# Patient Record
Sex: Female | Born: 1965
Health system: Southern US, Community
[De-identification: ages and names within clinical notes are randomized; demographics above are authoritative.]

## PROBLEM LIST (undated history)

## (undated) DIAGNOSIS — F419 Anxiety disorder, unspecified: Secondary | ICD-10-CM

## (undated) DIAGNOSIS — I10 Essential (primary) hypertension: Secondary | ICD-10-CM

## (undated) DIAGNOSIS — E785 Hyperlipidemia, unspecified: Secondary | ICD-10-CM

## (undated) DIAGNOSIS — G459 Transient cerebral ischemic attack, unspecified: Secondary | ICD-10-CM

## (undated) DIAGNOSIS — I639 Cerebral infarction, unspecified: Secondary | ICD-10-CM

## (undated) DIAGNOSIS — G4730 Sleep apnea, unspecified: Secondary | ICD-10-CM

## (undated) DIAGNOSIS — F329 Major depressive disorder, single episode, unspecified: Secondary | ICD-10-CM

## (undated) DIAGNOSIS — F32A Depression, unspecified: Secondary | ICD-10-CM

## (undated) DIAGNOSIS — N62 Hypertrophy of breast: Secondary | ICD-10-CM

## (undated) HISTORY — DX: Transient cerebral ischemic attack, unspecified: G45.9

## (undated) HISTORY — DX: Essential (primary) hypertension: I10

## (undated) HISTORY — DX: Hyperlipidemia, unspecified: E78.5

## (undated) HISTORY — DX: Depression, unspecified: F32.A

## (undated) HISTORY — DX: Anxiety disorder, unspecified: F41.9

## (undated) HISTORY — DX: Major depressive disorder, single episode, unspecified: F32.9

## (undated) SURGERY — BREAST REDUCTION WITH LIPOSUCTION
Anesthesia: General | Laterality: Bilateral

---
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
## 2008-10-07 DIAGNOSIS — I639 Cerebral infarction, unspecified: Secondary | ICD-10-CM | POA: Insufficient documentation

## 2008-11-11 DIAGNOSIS — R002 Palpitations: Secondary | ICD-10-CM | POA: Insufficient documentation

## 2008-11-14 ENCOUNTER — Ambulatory Visit: Payer: Self-pay | Admitting: Family Medicine

## 2008-11-15 ENCOUNTER — Ambulatory Visit: Payer: Self-pay | Admitting: Family Medicine

## 2009-02-07 HISTORY — PX: CAROTID ENDARTERECTOMY: SUR193

## 2009-11-17 DIAGNOSIS — L680 Hirsutism: Secondary | ICD-10-CM | POA: Insufficient documentation

## 2009-12-25 ENCOUNTER — Ambulatory Visit: Payer: Self-pay | Admitting: Family Medicine

## 2010-01-24 DIAGNOSIS — G4733 Obstructive sleep apnea (adult) (pediatric): Secondary | ICD-10-CM | POA: Insufficient documentation

## 2010-05-05 DIAGNOSIS — E785 Hyperlipidemia, unspecified: Secondary | ICD-10-CM | POA: Insufficient documentation

## 2010-06-11 DIAGNOSIS — E213 Hyperparathyroidism, unspecified: Secondary | ICD-10-CM

## 2010-06-11 HISTORY — DX: Hyperparathyroidism, unspecified: E21.3

## 2010-08-09 DIAGNOSIS — E079 Disorder of thyroid, unspecified: Secondary | ICD-10-CM | POA: Insufficient documentation

## 2010-08-11 DIAGNOSIS — I6529 Occlusion and stenosis of unspecified carotid artery: Secondary | ICD-10-CM | POA: Insufficient documentation

## 2010-11-30 ENCOUNTER — Ambulatory Visit: Payer: Self-pay | Admitting: Family Medicine

## 2011-04-22 ENCOUNTER — Ambulatory Visit: Payer: Self-pay | Admitting: Family Medicine

## 2012-08-23 ENCOUNTER — Ambulatory Visit: Payer: Self-pay | Admitting: Family Medicine

## 2012-08-23 LAB — HM MAMMOGRAPHY

## 2012-09-23 IMAGING — CR DG CHEST 2V
1 series · 2 of 2 positions shown · non-contrast
Comparison: none

REASON FOR EXAM: fever cough
COMMENTS:

PROCEDURE:     KDR - KDXR CHEST PA (OR AP) AND LAT  - November 30, 2010  [DATE]
RESULT:     The lungs are clear. The heart and pulmonary vessels are normal.
The bony and mediastinal structures are unremarkable. There is no effusion.
There is no pneumothorax or evidence of congestive failure.

[Series 1: view not recorded · 0.17mm/px · 2 of 2 slices shown]
[im 1/2]
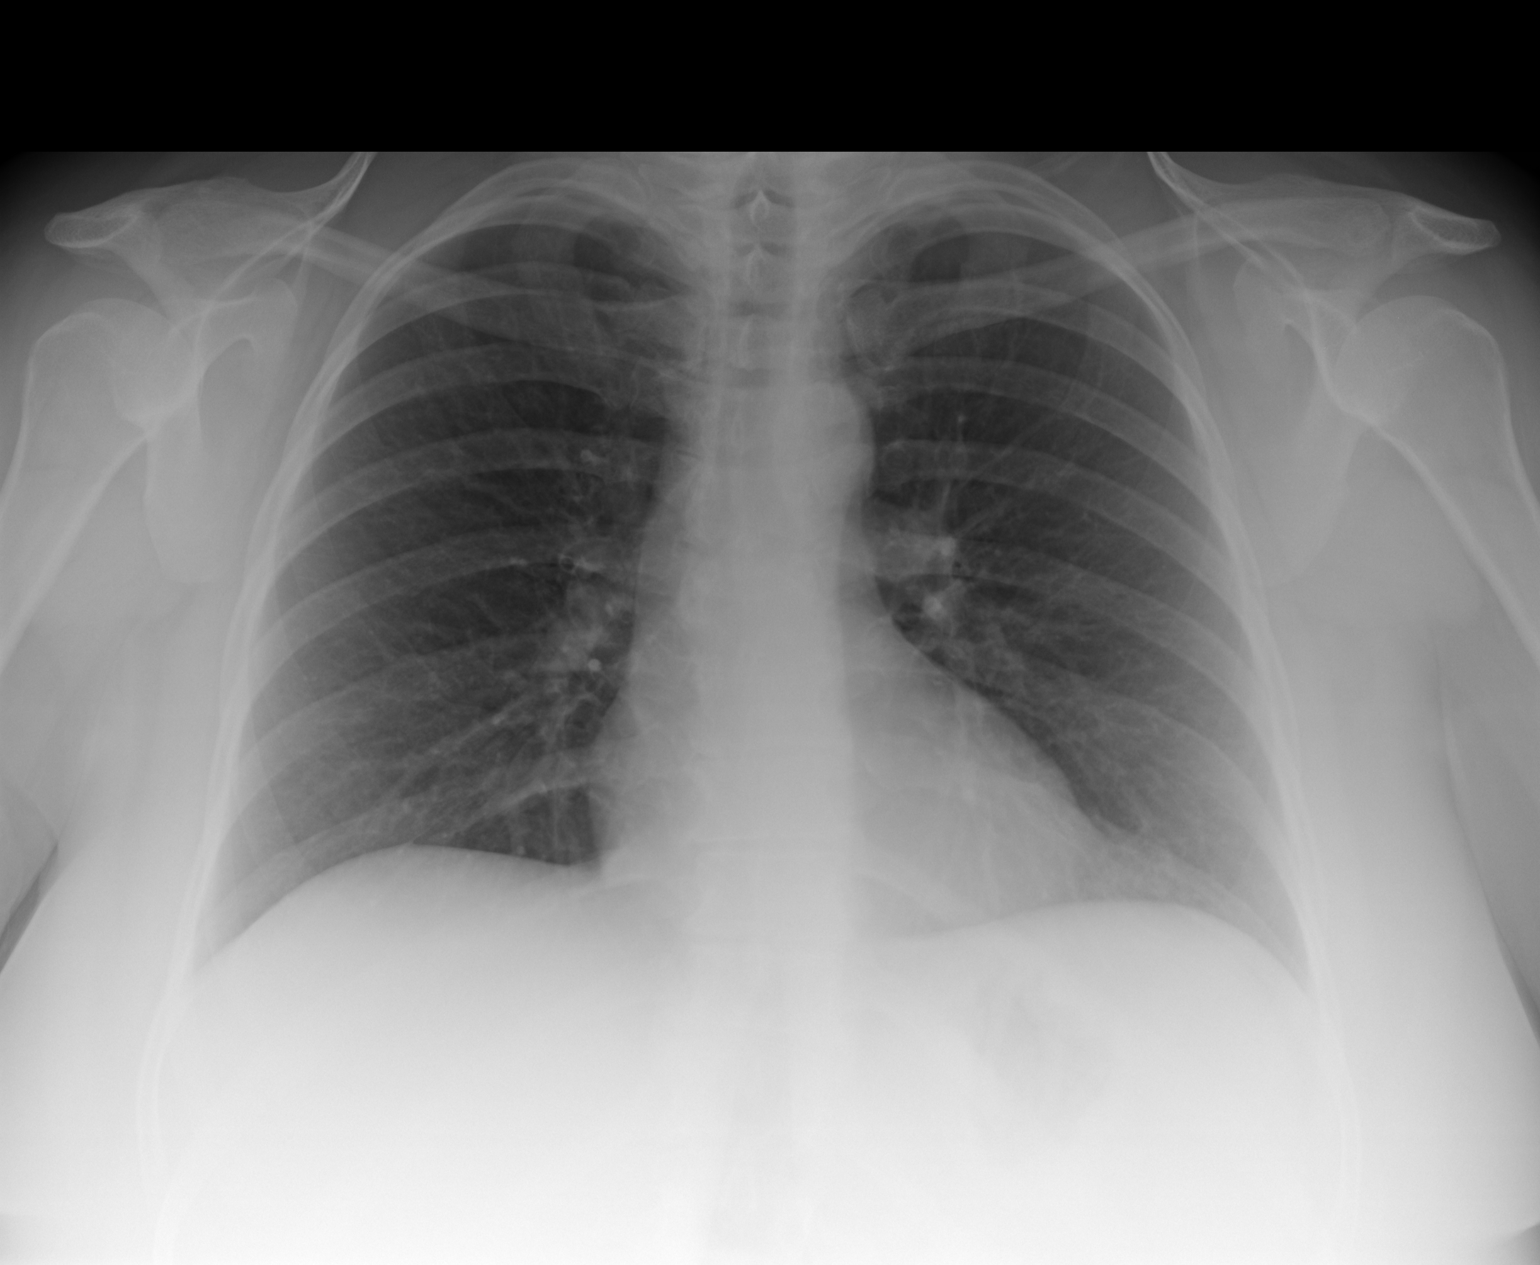
[im 2/2]
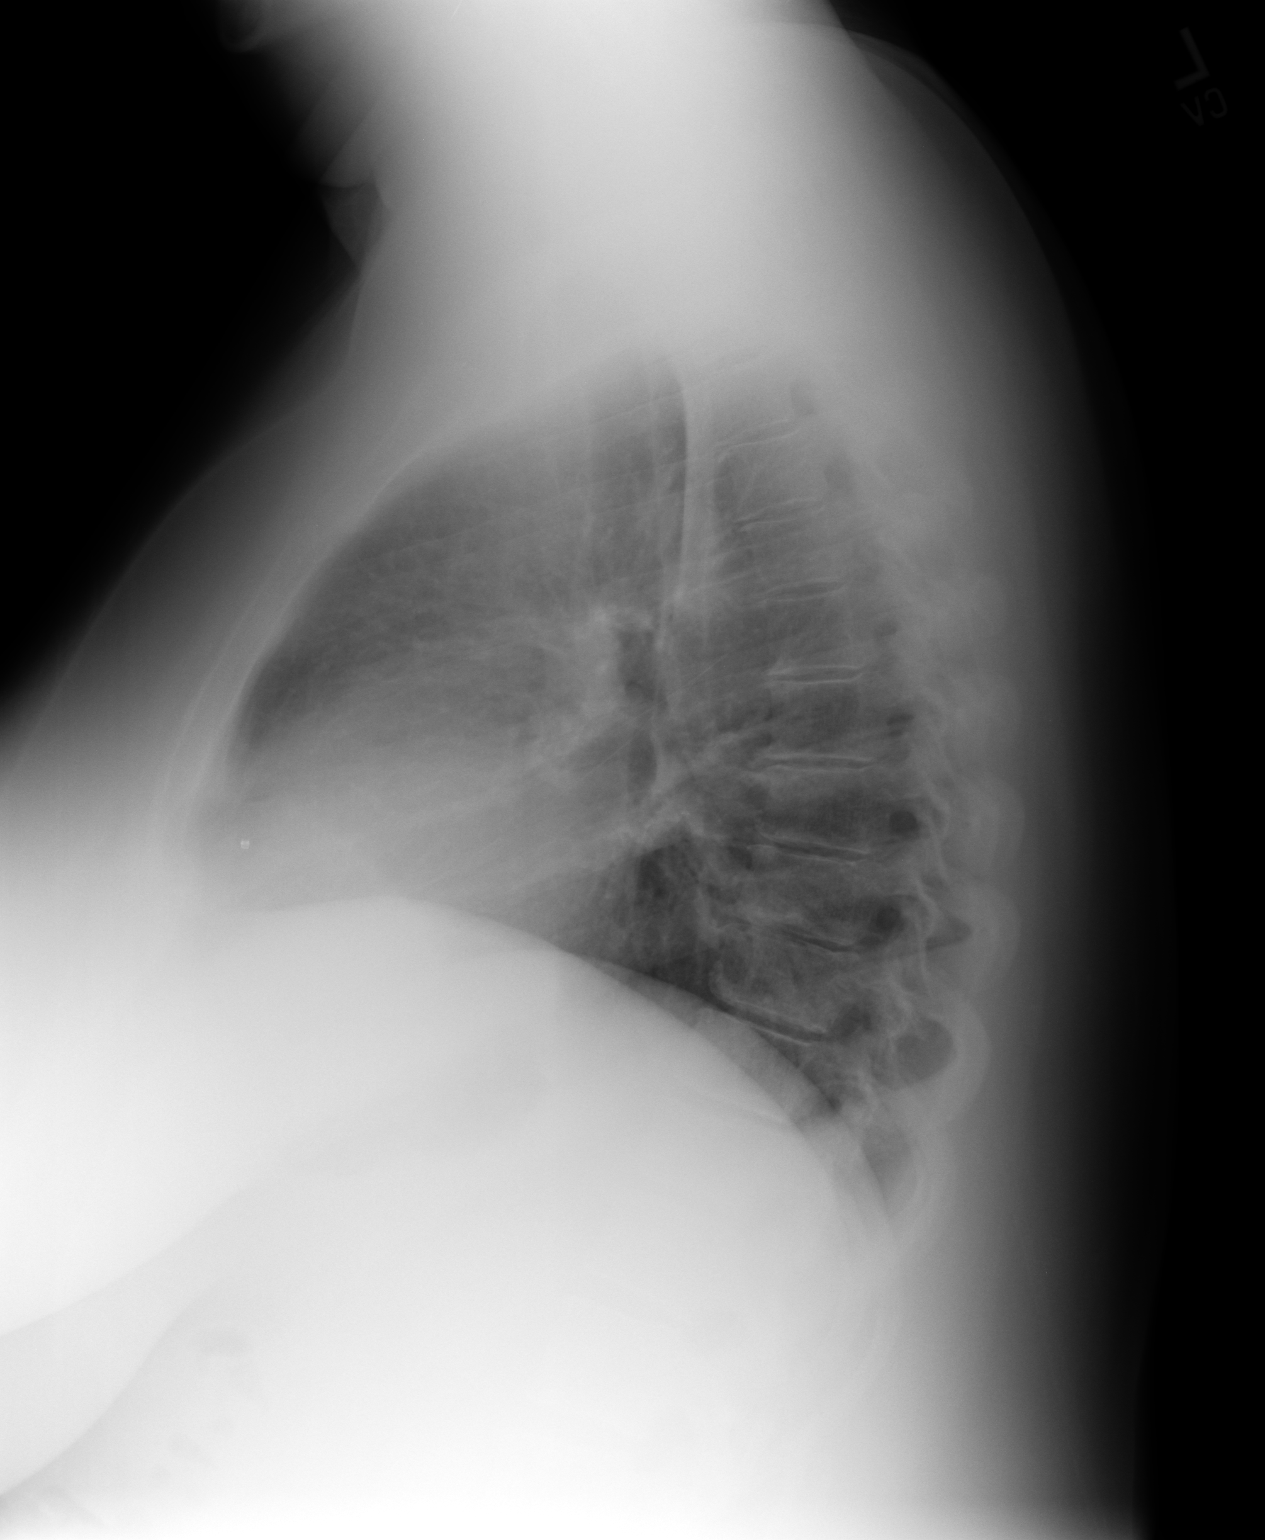

[2 of 2 positions shown; findings below may reference images not displayed]

IMPRESSION: No acute cardiopulmonary disease.

## 2013-02-13 IMAGING — US US EXTREM LOW VENOUS*L*
1 series · 17 of 24 positions shown · non-contrast
Comparison: none

REASON FOR EXAM: STAT CR to Dr Kellar [phone] or [phone] left
leg pain  eval DVT
COMMENTS:

PROCEDURE:     EDUARD - EDUARD DOPPLER VEINS LT LEG EXTR  - April 22, 2011  [DATE]
RESULT:     Comparison: None

[Series 1: us extrem low venous*left* · 17 of 28 slices shown]
[im 1/28]
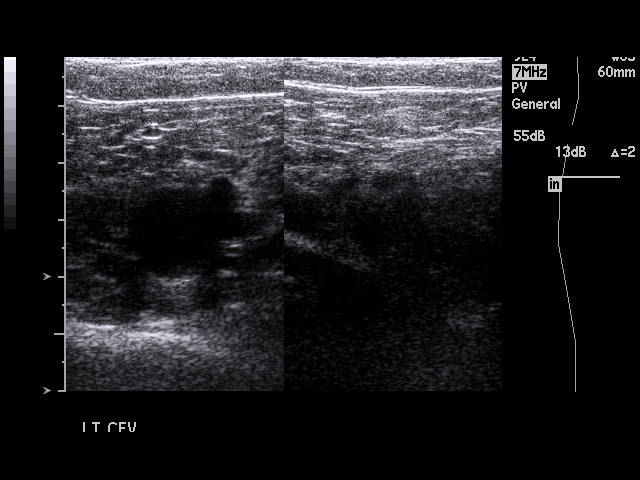
[im 3/28]
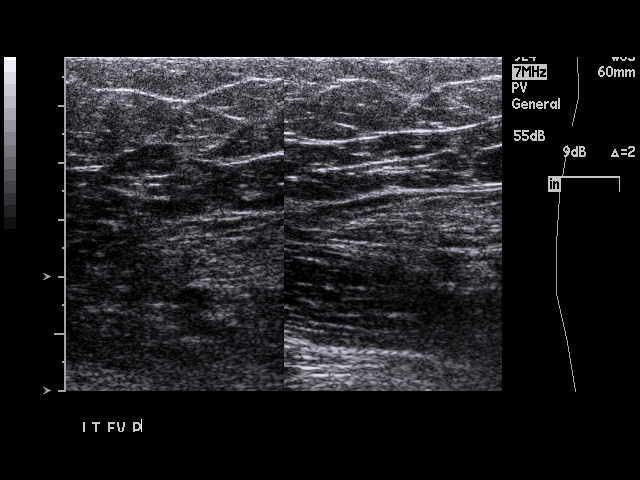
[im 4/28]
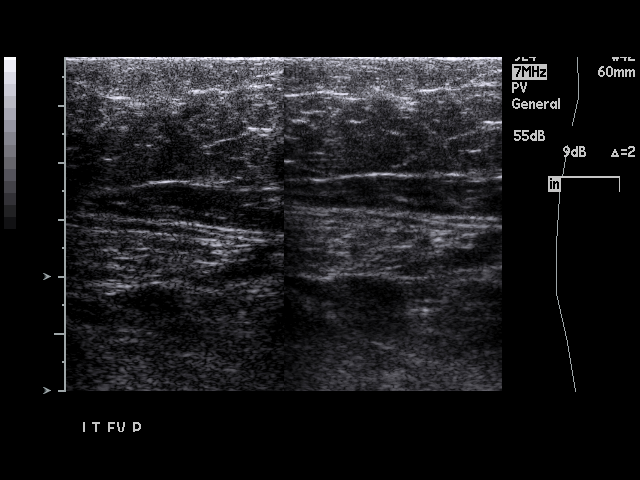
[im 5/28]
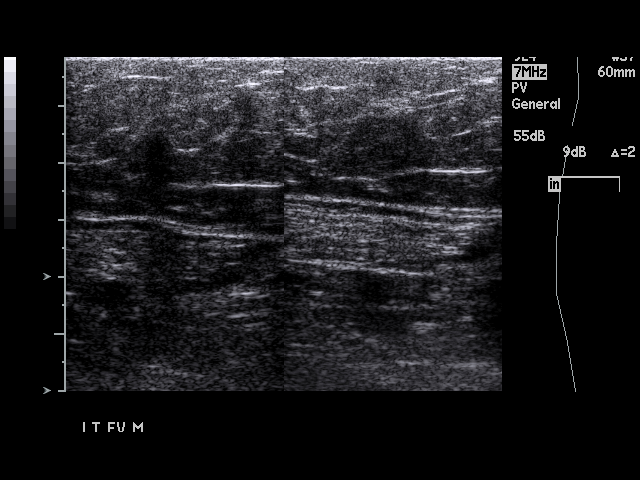
[im 8/28]
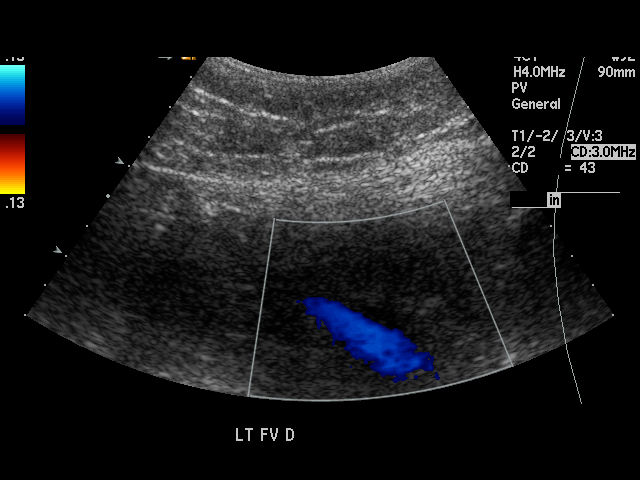
[im 9/28]
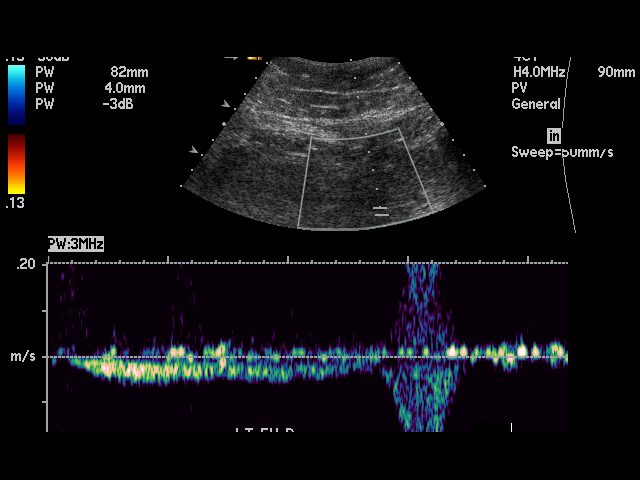
[im 11/28]
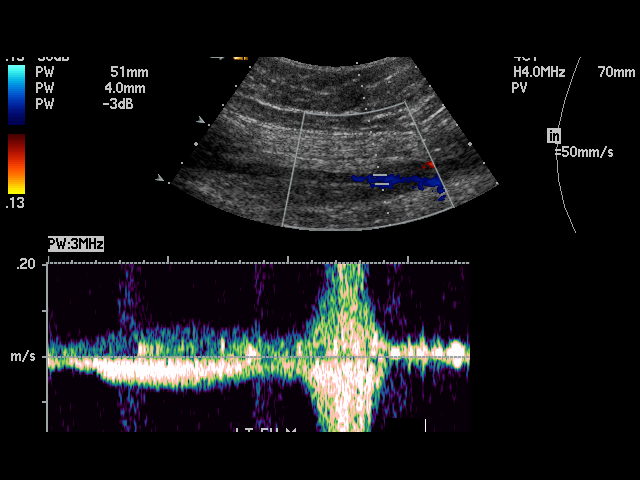
[im 12/28]
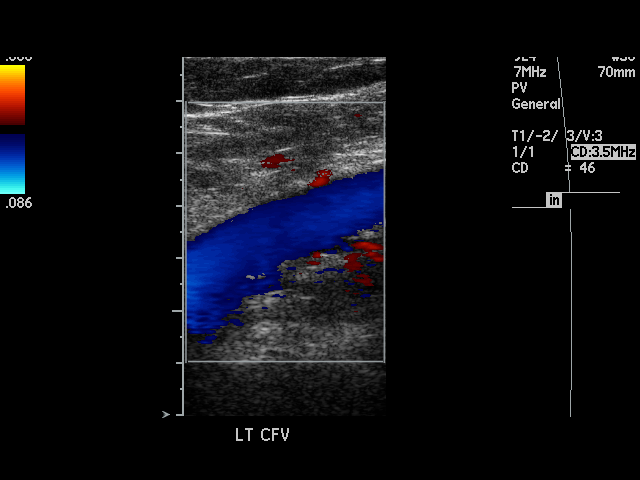
[im 15/28]
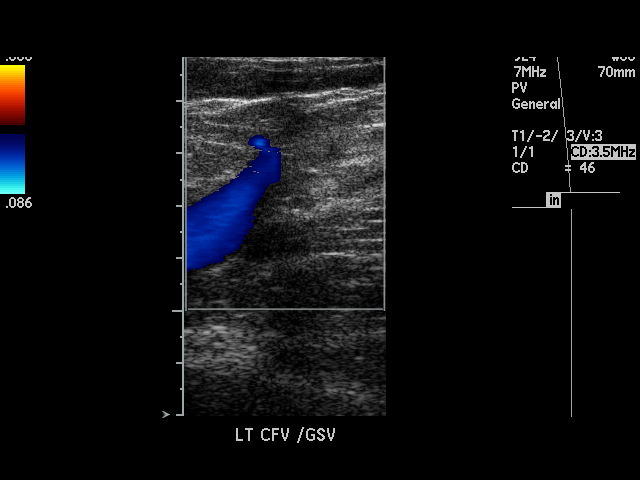
[im 16/28]
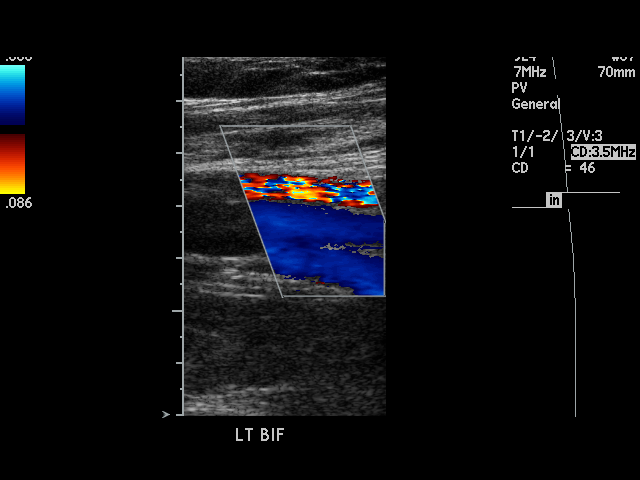
[im 17/28]
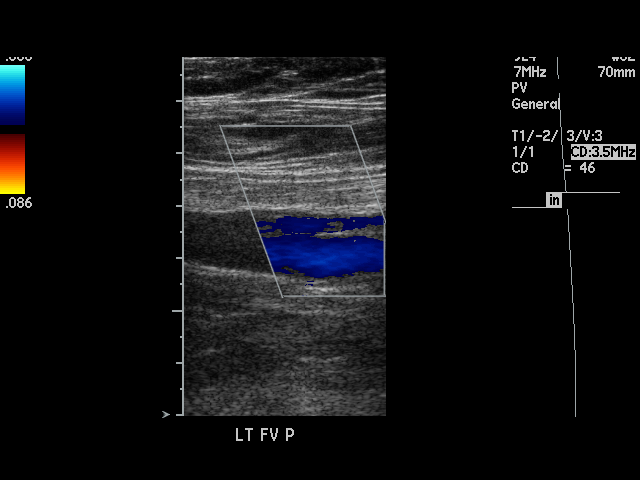
[im 19/28]
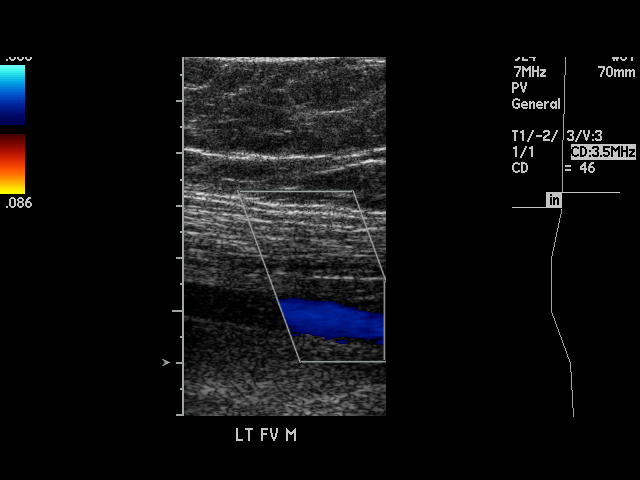
[im 20/28]
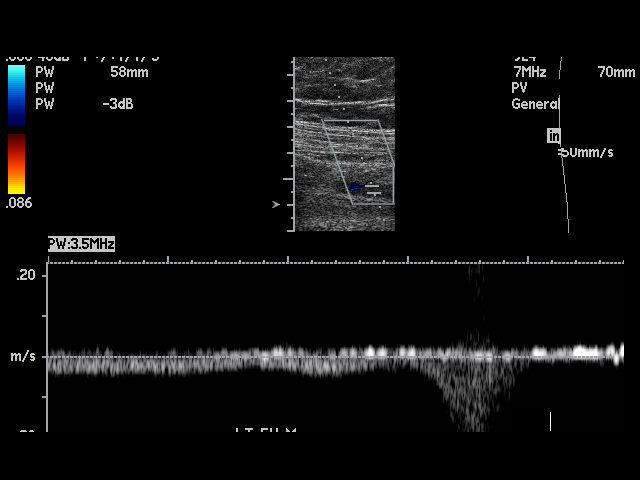
[im 23/28]
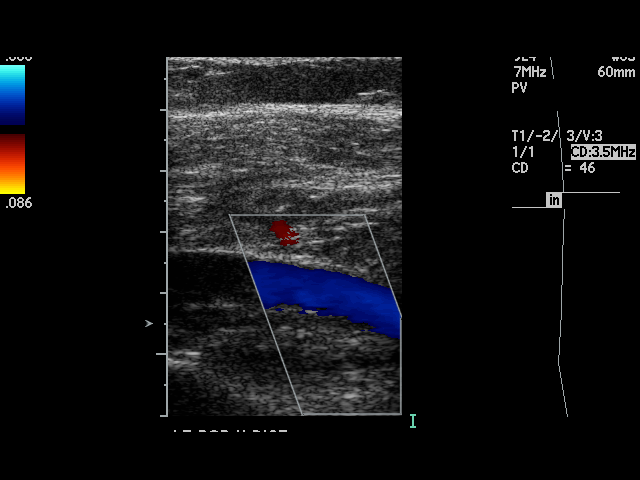
[im 24/28]
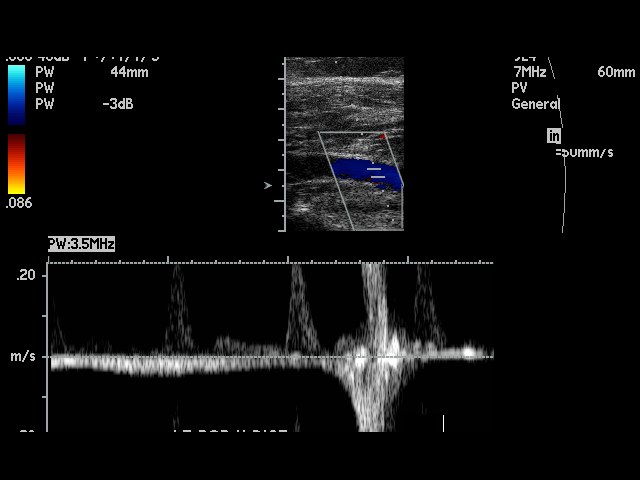
[im 25/28]
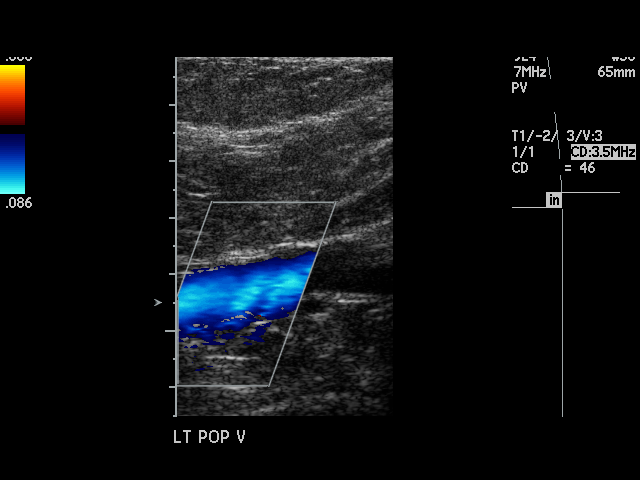
[im 28/28]
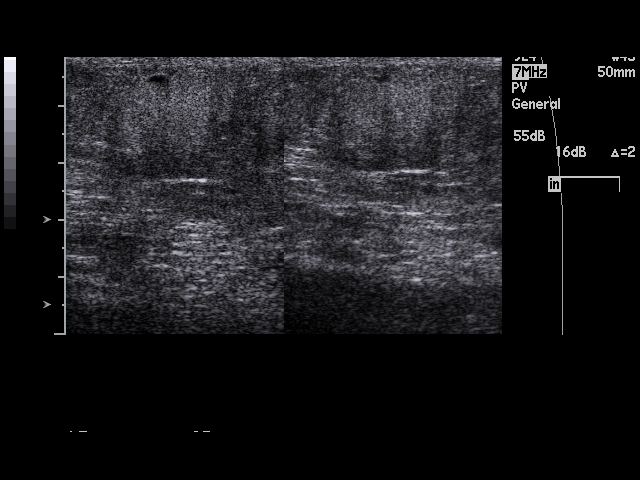

[17 of 24 positions shown; findings below may reference images not displayed]

FINDINGS: Multiple longitudinal and transverse gray-scale as well as color
and spectral Doppler images of the left lower extremity veins were obtained
from the common femoral veins through the popliteal veins.

The left common femoral, greater saphenous, femoral, popliteal veins, and
venous trifurcation are patent, demonstrating normal color-flow and
compressibility. No intraluminal thrombus is identified.There is normal
respiratory variation and augmentation demonstrated at all vein levels.
IMPRESSION: No evidence of DVT in the left lower extremity.

## 2013-06-27 LAB — HM PAP SMEAR: HM Pap smear: NEGATIVE

## 2014-08-08 LAB — LIPID PANEL
Cholesterol: 219 mg/dL — AB (ref 0–200)
HDL: 66 mg/dL (ref 35–70)
LDL Cholesterol: 129 mg/dL
Triglycerides: 118 mg/dL (ref 40–160)

## 2014-08-08 LAB — BASIC METABOLIC PANEL
BUN: 12 mg/dL (ref 4–21)
Creatinine: 0.7 mg/dL (ref 0.5–1.1)
Glucose: 91 mg/dL
Potassium: 4.5 mmol/L (ref 3.4–5.3)
Sodium: 141 mmol/L (ref 137–147)

## 2014-08-08 LAB — CBC AND DIFFERENTIAL
HCT: 42 % (ref 36–46)
Hemoglobin: 15.2 g/dL (ref 12.0–16.0)
WBC: 5.2 10*3/mL

## 2014-08-08 LAB — HEPATIC FUNCTION PANEL
ALT: 15 U/L (ref 7–35)
AST: 18 U/L (ref 13–35)

## 2015-01-27 ENCOUNTER — Telehealth: Payer: Self-pay | Admitting: Family Medicine

## 2015-02-06 ENCOUNTER — Other Ambulatory Visit: Payer: Self-pay | Admitting: Family Medicine

## 2015-02-06 DIAGNOSIS — F419 Anxiety disorder, unspecified: Secondary | ICD-10-CM

## 2015-02-06 NOTE — Telephone Encounter (Signed)
Printed, please fax or call in to pharmacy. Thank you.   

## 2015-04-16 DIAGNOSIS — M79669 Pain in unspecified lower leg: Secondary | ICD-10-CM | POA: Insufficient documentation

## 2015-04-16 DIAGNOSIS — G453 Amaurosis fugax: Secondary | ICD-10-CM

## 2015-04-16 DIAGNOSIS — B351 Tinea unguium: Secondary | ICD-10-CM | POA: Insufficient documentation

## 2015-04-16 DIAGNOSIS — F329 Major depressive disorder, single episode, unspecified: Secondary | ICD-10-CM | POA: Insufficient documentation

## 2015-04-16 DIAGNOSIS — E669 Obesity, unspecified: Secondary | ICD-10-CM | POA: Insufficient documentation

## 2015-04-16 DIAGNOSIS — F32A Depression, unspecified: Secondary | ICD-10-CM | POA: Insufficient documentation

## 2015-04-16 DIAGNOSIS — B354 Tinea corporis: Secondary | ICD-10-CM | POA: Insufficient documentation

## 2015-04-16 DIAGNOSIS — N926 Irregular menstruation, unspecified: Secondary | ICD-10-CM | POA: Insufficient documentation

## 2015-04-16 DIAGNOSIS — N72 Inflammatory disease of cervix uteri: Secondary | ICD-10-CM | POA: Insufficient documentation

## 2015-04-16 DIAGNOSIS — E559 Vitamin D deficiency, unspecified: Secondary | ICD-10-CM | POA: Insufficient documentation

## 2015-04-16 DIAGNOSIS — Z7251 High risk heterosexual behavior: Secondary | ICD-10-CM | POA: Insufficient documentation

## 2015-04-16 DIAGNOSIS — G43909 Migraine, unspecified, not intractable, without status migrainosus: Secondary | ICD-10-CM | POA: Insufficient documentation

## 2015-04-16 HISTORY — DX: Amaurosis fugax: G45.3

## 2015-04-17 ENCOUNTER — Ambulatory Visit (INDEPENDENT_AMBULATORY_CARE_PROVIDER_SITE_OTHER): Payer: BLUE CROSS/BLUE SHIELD | Admitting: Family Medicine

## 2015-04-17 ENCOUNTER — Encounter: Payer: Self-pay | Admitting: Family Medicine

## 2015-04-17 VITALS — BP 110/68 | HR 68 | Temp 98.3°F | Resp 16 | Ht 62.5 in | Wt 242.0 lb

## 2015-04-17 DIAGNOSIS — F325 Major depressive disorder, single episode, in full remission: Secondary | ICD-10-CM | POA: Insufficient documentation

## 2015-04-17 DIAGNOSIS — F324 Major depressive disorder, single episode, in partial remission: Secondary | ICD-10-CM

## 2015-04-17 MED ORDER — ESCITALOPRAM OXALATE 10 MG PO TABS: 10.0000 mg | ORAL_TABLET | Freq: Every day | ORAL | Status: DC

## 2015-04-17 NOTE — Progress Notes (Signed)
Patient: Rachael Gilmore Female    DOB: [birth year]   49 y.o.   MRN: [id number] Visit Date: 04/17/2015  Today's Provider: Lorie Phenix, MD   Chief Complaint  Patient presents with  . Depression   Subjective:    HPI   Follow up for Depression  The patient was last seen for this 9 months ago. Changes made at last visit include starting Escitalopram 10 mg. Has really helped.   She reports excellent compliance with treatment. She feels that condition is Improved. She is not having side effects. Patient reports that she would like to discontinue medication. Patient reports she is feeling 100% better.   Living in apartment by herself. Working 2 jobs and going to school. Has not need Xanax very much. Only needed it 2 times since June. Started working out again also.   Now wants to taper off Escitalopram.   Depression screen [facility] 2/9 04/17/2015  Decreased Interest 0  Down, Depressed, Hopeless 0  PHQ - 2 Score 0  Altered sleeping 1  Tired, decreased energy 0  Change in appetite 0  Feeling bad or failure about yourself  0  Trouble concentrating 0  Moving slowly or fidgety/restless 0  Suicidal thoughts 0  PHQ-9 Score 1  Difficult doing work/chores Not difficult at all      Allergies not on file Previous Medications   ALPRAZOLAM (XANAX) 0.5 MG TABLET    TAKE ONE-HALF TO ONE TABLET BY MOUTH TWICE DAILY AS NEEDED   ASPIRIN BUF,CACARB-MGCARB-MGO, (BUFFERED ASPIRIN) 325 MG TABS    Take 1 tablet by mouth daily.   COENZYME Q10 (CO Q 10) 100 MG CAPS    Take 1 capsule by mouth daily.   ESCITALOPRAM (LEXAPRO) 10 MG TABLET    Take 1 tablet by mouth daily.   MULTIPLE VITAMIN PO    Take 1 tablet by mouth daily.   NYSTATIN OINTMENT (MYCOSTATIN)    NYSTATIN, 100000 UNIT/GM (External Ointment)  1 Ointment Ointment twice daily as needed for 0 days  Quantity: 60;  Refills: 1   Ordered :28-Aug-2012  Allene Dillon ;  Started 28-Aug-2012 Active Comments: Medication taken as needed.      OMEGA-3 FATTY ACIDS (FISH OIL DOUBLE STRENGTH) 1200 MG CAPS    Take 1 capsule by mouth daily.   PRAVASTATIN (PRAVACHOL) 40 MG TABLET    Take 1 tablet by mouth at bedtime.   TRIAMTERENE-HYDROCHLOROTHIAZIDE (MAXZIDE-25) 37.5-25 MG PER TABLET    Take 1 tablet by mouth daily.    Review of Systems  Constitutional: Negative.   Cardiovascular: Negative.   Psychiatric/Behavioral: Negative.     Social History  Substance Use Topics  . Smoking status: Former Smoker    Quit date: 03/08/2009  . Smokeless tobacco: Never Used  . Alcohol Use: No   Objective:   BP 110/68 mmHg  Pulse 68  Temp(Src) 98.3 F (36.8 C) (Oral)  Resp 16  Ht 5' 2.5" (1.588 m)  Wt 242 lb (109.77 kg)  BMI 43.53 kg/m2  SpO2 98%  Physical Exam  Constitutional: She is oriented to person, place, and time. She appears well-developed and well-nourished.  Neurological: She is alert and oriented to person, place, and time.  Psychiatric: She has a normal mood and affect.      Assessment & Plan:     1. Major depression in remission Greatly improved. Will taper medication.  Call if has any problem with taper or is unsuccessful and remains on the medication.  -  escitalopram (LEXAPRO) 10 MG tablet; Take 1 tablet (10 mg total) by mouth daily. 1 alternating with 1/2 for about  2 weeks and then 1/2 every day for about 2 to 3 weeks and then one every other day for week or so and then off.  Dispense: 30 tablet; Refill: 1       Lorie Phenix, MD  [facility]

## 2015-07-21 ENCOUNTER — Ambulatory Visit (INDEPENDENT_AMBULATORY_CARE_PROVIDER_SITE_OTHER): Payer: BLUE CROSS/BLUE SHIELD | Admitting: Family Medicine

## 2015-07-21 ENCOUNTER — Encounter: Payer: Self-pay | Admitting: Family Medicine

## 2015-07-21 VITALS — BP 110/82 | HR 72 | Temp 98.7°F | Resp 16 | Wt 228.0 lb

## 2015-07-21 DIAGNOSIS — F39 Unspecified mood [affective] disorder: Secondary | ICD-10-CM | POA: Diagnosis not present

## 2015-07-21 DIAGNOSIS — E785 Hyperlipidemia, unspecified: Secondary | ICD-10-CM

## 2015-07-21 DIAGNOSIS — I10 Essential (primary) hypertension: Secondary | ICD-10-CM

## 2015-07-21 DIAGNOSIS — B354 Tinea corporis: Secondary | ICD-10-CM | POA: Diagnosis not present

## 2015-07-21 DIAGNOSIS — N62 Hypertrophy of breast: Secondary | ICD-10-CM | POA: Diagnosis not present

## 2015-07-21 DIAGNOSIS — F338 Other recurrent depressive disorders: Secondary | ICD-10-CM | POA: Insufficient documentation

## 2015-07-21 HISTORY — DX: Tinea corporis: B35.4

## 2015-07-21 MED ORDER — PRAVASTATIN SODIUM 20 MG PO TABS: 20.0000 mg | ORAL_TABLET | Freq: Every day | ORAL | Status: DC

## 2015-07-21 MED ORDER — NYSTATIN 100000 UNIT/GM EX CREA: 1.0000 "application " | TOPICAL_CREAM | Freq: Two times a day (BID) | CUTANEOUS | Status: DC

## 2015-07-21 NOTE — Progress Notes (Signed)
Patient ID: Neville Routengela M Butson, female   DOB: 12/29/1965, 49 y.o.   MRN: [id number]        Patient: Neville Routengela M Mathisen Female    DOB: 12/29/1965   49 y.o.   MRN: [id number] Visit Date: 07/21/2015  Today's Provider: Lorie PhenixNancy Maloney, MD   Chief Complaint  Patient presents with  . Depression  . Shoulder Pain   Subjective:    HPI     No Known Allergies Previous Medications   ALPRAZOLAM (XANAX) 0.5 MG TABLET    TAKE ONE-HALF TO ONE TABLET BY MOUTH TWICE DAILY AS NEEDED   ASPIRIN BUF,CACARB-MGCARB-MGO, (BUFFERED ASPIRIN) 325 MG TABS    Take 1 tablet by mouth daily.   COENZYME Q10 (CO Q 10) 100 MG CAPS    Take 1 capsule by mouth daily.   ESCITALOPRAM (LEXAPRO) 10 MG TABLET    Take 1 tablet (10 mg total) by mouth daily. 1 alternating with 1/2 for about  2 weeks and then 1/2 every day for about 2 to 3 weeks and then one every other day for week or so and then off.   MELATONIN-PYRIDOXINE (MELATONEX PO)    Take 1 tablet by mouth at bedtime.   MULTIPLE VITAMIN PO    Take 1 tablet by mouth daily.   NYSTATIN OINTMENT (MYCOSTATIN)    NYSTATIN, 100000 UNIT/GM (External Ointment)  1 Ointment Ointment twice daily as needed for 0 days  Quantity: 60;  Refills: 1   Ordered :28-Aug-2012  Allene Dillonrozdowski, Emily ;  Started 28-Aug-2012 Active Comments: Medication taken as needed.    OMEGA-3 FATTY ACIDS (FISH OIL DOUBLE STRENGTH) 1200 MG CAPS    Take 1 capsule by mouth daily.   PRAVASTATIN (PRAVACHOL) 40 MG TABLET    Take 1 tablet by mouth at bedtime.   TRIAMTERENE-HYDROCHLOROTHIAZIDE (MAXZIDE-25) 37.5-25 MG PER TABLET    Take 1 tablet by mouth daily.    Review of Systems  Social History  Substance Use Topics  . Smoking status: Former Smoker    Quit date: 03/08/2009  . Smokeless tobacco: Never Used  . Alcohol Use: No   Objective:   There were no vitals taken for this visit.  Physical Exam      Assessment & Plan:           Lorie PhenixNancy Maloney, MD  [facility]

## 2015-07-21 NOTE — Progress Notes (Signed)
Subjective:    Patient ID: Rachael Gilmore, female    DOB: 1966/05/02, 49 y.o.   MRN: [id number]  HPI Comments: Pt has recently bought a dog, which is improving her anxiety/depression. Pt is requesting a note from PCP stating that dog is beneficial to pt's health so he can be registered as a therapy dog.   Depression        This is a recurrent problem.  The current episode started more than 1 year ago.   The problem occurs daily.  The problem has been gradually worsening since onset.  Associated symptoms include decreased concentration, fatigue, body aches, myalgias and headaches (secondary to back pain).  Associated symptoms include no helplessness, no hopelessness, does not have insomnia, not irritable, no restlessness, no decreased interest, no appetite change, no indigestion, not sad and no suicidal ideas.     Exacerbated by: seasonal changes, 2 jobs without benefits, full time student for business administration.    Past treatments include SSRIs - Selective serotonin reuptake inhibitors and other medications (Lexapro 10 mg (which pt has D/C by choice), has done well with this.  Does use  Xanax as needed.  Dog has been great. Has really helped  with mood, sleep, etc. ).  Compliance with treatment is good. Rash This is a recurrent problem. Location: under breasts. The rash is characterized by redness and itchiness (Same as previous. ). She was exposed to nothing (this rash appears with working out). Associated symptoms include fatigue. Treatments tried: Nystatin. Helped in the past.  The treatment provided moderate relief.  Back Pain This is a new problem. Pain location: C spine and bilateral shoulders. The quality of the pain is described as aching. The pain does not radiate. The pain is at a severity of 7/10. The pain is severe. The pain is worse during the day. Exacerbated by: pt believes this is secondary to large breasts. Associated symptoms include headaches (secondary to back pain). Risk  factors: Very large breast, in a 42 G, but could be even higher. Does have indentatiions in shoullders from bras.  She has tried heat and NSAIDs (massage therapy helps some. ) for the symptoms. The treatment provided mild relief.  Pt is requesting referral to surgery, Dr. Louisa Second with [facility] for breast reduction.    Review of Systems  Constitutional: Positive for fatigue. Negative for appetite change and unexpected weight change.  Musculoskeletal: Positive for myalgias and back pain.  Skin: Positive for rash.  Neurological: Positive for headaches (secondary to back pain).  Psychiatric/Behavioral: Positive for depression and decreased concentration. Negative for suicidal ideas. The patient is not nervous/anxious and does not have insomnia.    BP 110/82 mmHg  Pulse 72  Temp(Src) 98.7 F (37.1 C) (Oral)  Resp 16  Wt 228 lb (103.42 kg)   Patient Active Problem List   Diagnosis Date Noted  . Major depression in remission (HCC) 04/17/2015  . AF (amaurosis fugax) 04/16/2015  . Cervical inflammation 04/16/2015  . Irregular bleeding 04/16/2015  . Headache, migraine 04/16/2015  . Adiposity 04/16/2015  . Fungal infection of nail 04/16/2015  . Body tinea 04/16/2015  . Avitaminosis D 04/16/2015  . Anxiety 02/06/2015  . Carotid artery obstruction 08/11/2010  . HPTH (hyperparathyroidism) (HCC) 06/11/2010  . Hyperparathyroidism (HCC) 06/11/2010  . Apnea, sleep 01/24/2010  . Hairiness 11/17/2009  . Benign hypertension 03/07/2009  . Hypercholesteremia 02/27/2009  . Peripheral blood vessel disorder (HCC) 02/27/2009  . Awareness of heartbeats 11/11/2008  . Vision  disturbance 11/11/2008   Past Medical History  Diagnosis Date  . Anxiety   . Depression   . Hypertension   . Hyperlipidemia    Current Outpatient Prescriptions on File Prior to Visit  Medication Sig  . ALPRAZolam (XANAX) 0.5 MG tablet TAKE ONE-HALF TO ONE TABLET BY MOUTH TWICE DAILY AS  NEEDED  . Aspirin Buf,CaCarb-MgCarb-MgO, (BUFFERED ASPIRIN) 325 MG TABS Take 1 tablet by mouth daily.  . Coenzyme Q10 (CO Q 10) 100 MG CAPS Take 1 capsule by mouth daily.  . MULTIPLE VITAMIN PO Take 1 tablet by mouth daily.  Marland Kitchen nystatin ointment (MYCOSTATIN) NYSTATIN, 100000 UNIT/GM (External Ointment)  1 Ointment Ointment twice daily as needed for 0 days  Quantity: 60;  Refills: 1   Ordered :28-Aug-2012  Allene Dillon ;  Started 28-Aug-2012 Active Comments: Medication taken as needed.   . Omega-3 Fatty Acids (FISH OIL DOUBLE STRENGTH) 1200 MG CAPS Take 1 capsule by mouth daily.  . pravastatin (PRAVACHOL) 40 MG tablet Take 20 mg by mouth at bedtime.   . triamterene-hydrochlorothiazide (MAXZIDE-25) 37.5-25 MG per tablet Take 1 tablet by mouth daily.  . Melatonin-Pyridoxine (MELATONEX PO) Take 1 tablet by mouth at bedtime.   No current facility-administered medications on file prior to visit.   No Known Allergies Past Surgical History  Procedure Laterality Date  . Cesarean section  1992 and 1994  . Tonsillectomy  1969  . Endarterectomy Left 2010    left carotid   Social History   Social History  . Marital Status: Legally Separated    Spouse Name: N/A  . Number of Children: N/A  . Years of Education: N/A   Occupational History  . Not on file.   Social History Main Topics  . Smoking status: Former Smoker    Quit date: 03/08/2009  . Smokeless tobacco: Never Used  . Alcohol Use: No  . Drug Use: No  . Sexual Activity: Not on file   Other Topics Concern  . Not on file   Social History Narrative   Family History  Problem Relation Age of Onset  . Albinism Son   . Autism Son       Objective:   Physical Exam  Constitutional: She is oriented to person, place, and time. She appears well-developed and well-nourished. She is not irritable.  Pulmonary/Chest: Right breast exhibits no inverted nipple, no mass, no nipple discharge, no skin change and no tenderness. Left  breast exhibits no inverted nipple, no mass, no nipple discharge, no skin change and no tenderness. Breasts are symmetrical.  Large breasts with bilateral shoulder indentations noted.   Neurological: She is alert and oriented to person, place, and time.  Skin: Rash noted. There is erythema.  Mild under breast.   Psychiatric: She has a normal mood and affect. Her behavior is normal. Judgment and thought content normal.  Vitals reviewed.  BP 110/82 mmHg  Pulse 72  Temp(Src) 98.7 F (37.1 C) (Oral)  Resp 16  Wt 228 lb (103.42 kg)     Assessment & Plan:  1. Benign hypertension Condition is stable. Please continue current medication and  plan of care as noted.    2. Large breasts Causing a lot of back pain.  Will refer for possible breast reduction.   - Ambulatory referral to Plastic Surgery  3. Seasonal affective disorder (HCC) Is going to start Vitamin D and Vitamin B12.  Is also going to get a special lamp.   4. Tinea corporis Will refill medication.  - nystatin cream (  MYCOSTATIN); Apply 1 application topically 2 (two) times daily.  Dispense: 60 g; Refill: 5  5. Hyperlipemia Is having trouble with 40 mg. Will decrease back to 20 mg and recheck labs in several months.  - pravastatin (PRAVACHOL) 20 MG tablet; Take 1 tablet (20 mg total) by mouth daily.  Dispense: 90 tablet; Refill: 3  Lorie PhenixNancy Trenia Tennyson, MD

## 2015-07-29 ENCOUNTER — Other Ambulatory Visit: Payer: Self-pay | Admitting: Family Medicine

## 2015-07-29 DIAGNOSIS — I10 Essential (primary) hypertension: Secondary | ICD-10-CM

## 2015-09-10 ENCOUNTER — Telehealth: Payer: Self-pay | Admitting: Family Medicine

## 2015-09-10 NOTE — Telephone Encounter (Signed)
Pt is requesting a letter stating her dog is a therapy dog.  Pt states she need to send this so her dog can be registered as a therapy dog.  ZO#[phone]

## 2015-09-10 NOTE — Telephone Encounter (Signed)
Okey Regal- Can you write this letter. Thanks.

## 2015-09-12 ENCOUNTER — Encounter: Payer: BLUE CROSS/BLUE SHIELD | Admitting: Family Medicine

## 2015-10-30 ENCOUNTER — Encounter: Payer: Self-pay | Admitting: Family Medicine

## 2015-10-30 ENCOUNTER — Ambulatory Visit (INDEPENDENT_AMBULATORY_CARE_PROVIDER_SITE_OTHER): Payer: BLUE CROSS/BLUE SHIELD | Admitting: Family Medicine

## 2015-10-30 VITALS — BP 112/70 | HR 80 | Temp 98.5°F | Resp 16 | Ht 63.0 in | Wt 229.0 lb

## 2015-10-30 DIAGNOSIS — Z1231 Encounter for screening mammogram for malignant neoplasm of breast: Secondary | ICD-10-CM

## 2015-10-30 DIAGNOSIS — Z1211 Encounter for screening for malignant neoplasm of colon: Secondary | ICD-10-CM | POA: Diagnosis not present

## 2015-10-30 DIAGNOSIS — E213 Hyperparathyroidism, unspecified: Secondary | ICD-10-CM | POA: Diagnosis not present

## 2015-10-30 DIAGNOSIS — Z124 Encounter for screening for malignant neoplasm of cervix: Secondary | ICD-10-CM | POA: Diagnosis not present

## 2015-10-30 DIAGNOSIS — E785 Hyperlipidemia, unspecified: Secondary | ICD-10-CM

## 2015-10-30 DIAGNOSIS — Z126 Encounter for screening for malignant neoplasm of bladder: Secondary | ICD-10-CM

## 2015-10-30 DIAGNOSIS — Z0000 Encounter for general adult medical examination without abnormal findings: Secondary | ICD-10-CM | POA: Diagnosis not present

## 2015-10-30 DIAGNOSIS — F419 Anxiety disorder, unspecified: Secondary | ICD-10-CM

## 2015-10-30 DIAGNOSIS — I10 Essential (primary) hypertension: Secondary | ICD-10-CM | POA: Diagnosis not present

## 2015-10-30 LAB — POCT URINALYSIS DIPSTICK
Bilirubin, UA: NEGATIVE
Blood, UA: NEGATIVE
GLUCOSE UA: NEGATIVE
KETONES UA: NEGATIVE
LEUKOCYTES UA: NEGATIVE
Nitrite, UA: NEGATIVE
PROTEIN UA: NEGATIVE
SPEC GRAV UA: 1.01
Urobilinogen, UA: 0.2
pH, UA: 6

## 2015-10-30 NOTE — Progress Notes (Signed)
Patient: Rachael Gilmore, Female    DOB: 11-22-1965, 50 y.o.   MRN: [id number] Visit Date: 10/30/2015  Today's Provider: Lorie Phenix, MD   Chief Complaint  Patient presents with  . Annual Exam   Subjective:    Annual physical exam Rachael Gilmore is a 50 y.o. female who presents today for health maintenance and complete physical. She feels well. She reports exercising 4-5 days per week (40 minutes of cardio and 20-30 minutes of weight). She reports she is sleeping fairly well.  Last CPE- 07/11/2014 Last Pap- 06/27/2013- Negative. HPV negative Last Mammo- 08/23/2012- BI-RADS 1 -----------------------------------------------------------------   Review of Systems  Constitutional: Positive for fever ( on Saturday and Tuesday ).  HENT: Positive for sinus pressure and sore throat.   Psychiatric/Behavioral: The patient is nervous/anxious.   All other systems reviewed and are negative.   Social History      She  reports that she quit smoking about 6 years ago. She has never used smokeless tobacco. She reports that she drinks alcohol. She reports that she does not use illicit drugs.       Social History   Social History  . Marital Status: Legally Separated    Spouse Name: N/A  . Number of Children: 2  . Years of Education: 15   Occupational History  .      2 part time jobs, at [employer]. [employer], and [employer]   Social History Main Topics  . Smoking status: Former Smoker    Quit date: 03/08/2009  . Smokeless tobacco: Never Used  . Alcohol Use: Yes     Comment: socially  . Drug Use: No  . Sexual Activity: Not Asked   Other Topics Concern  . None   Social History Narrative    Past Medical History  Diagnosis Date  . Anxiety   . Depression   . Hypertension   . Hyperlipidemia      Patient Active Problem List   Diagnosis Date Noted  . Large breasts 07/21/2015  . Seasonal affective disorder (HCC) 07/21/2015  . Tinea corporis 07/21/2015  . Hyperlipemia  07/21/2015  . Major depression in remission (HCC) 04/17/2015  . AF (amaurosis fugax) 04/16/2015  . Cervical inflammation 04/16/2015  . Irregular bleeding 04/16/2015  . Headache, migraine 04/16/2015  . Adiposity 04/16/2015  . Fungal infection of nail 04/16/2015  . Body tinea 04/16/2015  . Avitaminosis D 04/16/2015  . Anxiety 02/06/2015  . Carotid artery obstruction 08/11/2010  . HPTH (hyperparathyroidism) (HCC) 06/11/2010  . Hyperparathyroidism (HCC) 06/11/2010  . Apnea, sleep 01/24/2010  . Hairiness 11/17/2009  . Benign hypertension 03/07/2009  . Hypercholesteremia 02/27/2009  . Peripheral blood vessel disorder (HCC) 02/27/2009  . Awareness of heartbeats 11/11/2008  . Vision disturbance 11/11/2008    Past Surgical History  Procedure Laterality Date  . Cesarean section  1992 and 1994  . Tonsillectomy  1969  . Endarterectomy Left 2010    left carotid    Family History        Family Status  Relation Status Death Age  . Son Alive   . Son Alive   . Mother Alive   . Father Deceased 107    MVA        Her family history includes Albinism in her son; Arrhythmia in her mother; Autism in her son.    No Known Allergies  Previous Medications   ALPRAZOLAM (XANAX) 0.5 MG TABLET    TAKE ONE-HALF TO ONE TABLET BY  MOUTH TWICE DAILY AS NEEDED   ASPIRIN BUF,CACARB-MGCARB-MGO, (BUFFERED ASPIRIN) 325 MG TABS    Take 1 tablet by mouth daily.   COENZYME Q10 (CO Q 10) 100 MG CAPS    Take 1 capsule by mouth daily.   MULTIPLE VITAMIN PO    Take 1 tablet by mouth daily.   NYSTATIN CREAM (MYCOSTATIN)    Apply 1 application topically 2 (two) times daily.   OMEGA-3 FATTY ACIDS (FISH OIL DOUBLE STRENGTH) 1200 MG CAPS    Take 1 capsule by mouth daily.   PRAVASTATIN (PRAVACHOL) 20 MG TABLET    Take 1 tablet (20 mg total) by mouth daily.   TRIAMTERENE-HYDROCHLOROTHIAZIDE (MAXZIDE-25) 37.5-25 MG TABLET    TAKE ONE TABLET BY MOUTH ONCE DAILY    Patient Care Team: Lorie PhenixNancy Aubrei Bouchie, MD as PCP - General  (Family Medicine)     Objective:   Vitals: BP 112/70 mmHg  Pulse 80  Temp(Src) 98.5 F (36.9 C) (Oral)  Resp 16  Ht 5\' 3"  (1.6 m)  Wt 229 lb (103.874 kg)  BMI 40.58 kg/m2   Physical Exam  Constitutional: She is oriented to person, place, and time. She appears well-developed and well-nourished.  HENT:  Head: Normocephalic and atraumatic.  Right Ear: Tympanic membrane, external ear and ear canal normal.  Left Ear: Tympanic membrane, external ear and ear canal normal.  Nose: Nose normal.  Mouth/Throat: Uvula is midline, oropharynx is clear and moist and mucous membranes are normal.  Eyes: Conjunctivae, EOM and lids are normal. Pupils are equal, round, and reactive to light.  Neck: Trachea normal and normal range of motion. Neck supple. Carotid bruit is not present. No thyroid mass and no thyromegaly present.  Cardiovascular: Normal rate, regular rhythm and normal heart sounds.   Pulmonary/Chest: Effort normal and breath sounds normal.  Abdominal: Soft. Normal appearance and bowel sounds are normal. There is no hepatosplenomegaly. There is no tenderness.  Genitourinary: Vagina normal. No breast swelling, tenderness or discharge.  Musculoskeletal: Normal range of motion.  Lymphadenopathy:    She has no cervical adenopathy.    She has no axillary adenopathy.  Neurological: She is alert and oriented to person, place, and time. She has normal strength. No cranial nerve deficit.  Skin: Skin is warm, dry and intact.  Psychiatric: She has a normal mood and affect. Her speech is normal and behavior is normal. Judgment and thought content normal. Cognition and memory are normal.     Depression Screen PHQ 2/9 Scores 10/30/2015 04/17/2015  PHQ - 2 Score 0 0  PHQ- 9 Score - 1      Assessment & Plan:     Routine Health Maintenance and Physical Exam  Exercise Activities and Dietary recommendations Goals    None      Immunization History  Administered Date(s) Administered  . Tdap  07/11/2014    Health Maintenance  Topic Date Due  . Hepatitis C Screening  02-01-1966  . HIV Screening  10/24/1978  . COLONOSCOPY  10/23/2013  . MAMMOGRAM  08/23/2014  . INFLUENZA VACCINE  07/20/2016 (Originally 03/10/2015)  . PAP SMEAR  06/27/2016  . TETANUS/TDAP  07/11/2024      Discussed health benefits of physical activity, and encouraged her to engage in regular exercise appropriate for her age and condition.    --------------------------------------------------------------------  1. Annual physical exam Stable. As above. Continue diet and exercise.  2. Benign hypertension Stable. Continue current medications and check labs as below. FU pending results. - CBC with Differential/Platelet  3.  Hyperlipemia FU pending labs. Pt states she has been inconsistent with statin use. - Lipid panel  4. Anxiety Stable. Note written that her dog is therapeutic for her anxiety. Check labs as below. - TSH  5. Bladder cancer screening UA negative. - POCT urinalysis dipstick  Results for orders placed or performed in visit on 10/30/15  POCT urinalysis dipstick  Result Value Ref Range   Color, UA Yellow    Clarity, UA Clear    Glucose, UA Negative    Bilirubin, UA Negative    Ketones, UA Negative    Spec Grav, UA 1.010    Blood, UA Negative    pH, UA 6.0    Protein, UA Negative    Urobilinogen, UA 0.2    Nitrite, UA Negative    Leukocytes, UA Negative Negative    6. Encounter for screening mammogram for breast cancer Ordered mammogram. - MM DIGITAL SCREENING BILATERAL; Future  7. Screening for cervical cancer New sexual partner. Will check for HPV, gonorrhea and chlamydia. FU pending pap results. - Pap LB, CT/NG NAA, and HPV (high risk)  8. HPTH (hyperparathyroidism) (HCC) FU pending results. - Comprehensive metabolic panel  9. Colon cancer screening Refer for colonoscopy. - Ambulatory referral to Gastroenterology   Patient seen and examined by Leo Grosser,  MD, and note scribed by Allene Dillon, CMA.  I have reviewed the document for accuracy and completeness and I agree with above. Leo Grosser, MD   Lorie Phenix, MD

## 2015-11-03 ENCOUNTER — Telehealth: Payer: Self-pay

## 2015-11-03 LAB — PAP LB, CT-NG NAA, HPV HIGH-RISK
CHLAMYDIA, NUC. ACID AMP: NEGATIVE
Gonococcus, Nuc. Acid Amp: NEGATIVE
HPV, HIGH-RISK: NEGATIVE
PAP SMEAR COMMENT: 0

## 2015-11-03 NOTE — Telephone Encounter (Signed)
Left message to call back  

## 2015-11-03 NOTE — Telephone Encounter (Signed)
-----   Message from Lorie PhenixNancy Maloney, MD sent at 11/03/2015  1:44 PM EDT ----- Pap is normal. Please notify patient.

## 2015-11-10 NOTE — Telephone Encounter (Signed)
Pt advised.   Thanks,   -Zeeva Courser  

## 2015-11-11 ENCOUNTER — Telehealth: Payer: Self-pay

## 2015-11-11 DIAGNOSIS — I10 Essential (primary) hypertension: Secondary | ICD-10-CM | POA: Diagnosis not present

## 2015-11-11 DIAGNOSIS — E213 Hyperparathyroidism, unspecified: Secondary | ICD-10-CM | POA: Diagnosis not present

## 2015-11-11 DIAGNOSIS — F419 Anxiety disorder, unspecified: Secondary | ICD-10-CM | POA: Diagnosis not present

## 2015-11-11 DIAGNOSIS — B351 Tinea unguium: Secondary | ICD-10-CM

## 2015-11-11 DIAGNOSIS — E785 Hyperlipidemia, unspecified: Secondary | ICD-10-CM | POA: Diagnosis not present

## 2015-11-11 MED ORDER — TERBINAFINE HCL 250 MG PO TABS: 250.0000 mg | ORAL_TABLET | Freq: Every day | ORAL | Status: DC

## 2015-11-11 NOTE — Telephone Encounter (Signed)
Advised patient as below. Letter placed up front for patient.

## 2015-11-11 NOTE — Telephone Encounter (Signed)
Sent rx.   Make sure gets labs checked in one month to make sure liver ok on medication and wrote rx. Thanks.

## 2015-11-11 NOTE — Telephone Encounter (Signed)
Pt is need of a note about keeping her dog in her apartment as a therapy dog.  She says she already discussed it with you at her physical.  She would also like refills on her Lamisil 250mg .  She is having trouble with toenail fungus again and would like a 90 day supply sent to [facility] [address]   Thanks,   -[name]

## 2015-11-12 ENCOUNTER — Telehealth: Payer: Self-pay

## 2015-11-12 LAB — COMPREHENSIVE METABOLIC PANEL
A/G RATIO: 1.8 (ref 1.2–2.2)
ALBUMIN: 4.7 g/dL (ref 3.5–5.5)
ALT: 9 IU/L (ref 0–32)
AST: 15 IU/L (ref 0–40)
Alkaline Phosphatase: 93 IU/L (ref 39–117)
BILIRUBIN TOTAL: 0.6 mg/dL (ref 0.0–1.2)
BUN / CREAT RATIO: 22 (ref 9–23)
BUN: 15 mg/dL (ref 6–24)
CO2: 26 mmol/L (ref 18–29)
Calcium: 10 mg/dL (ref 8.7–10.2)
Chloride: 95 mmol/L — ABNORMAL LOW (ref 96–106)
Creatinine, Ser: 0.67 mg/dL (ref 0.57–1.00)
GFR calc non Af Amer: 101 mL/min/{1.73_m2} (ref >59)
GFR, EST AFRICAN AMERICAN: 117 mL/min/{1.73_m2} (ref >59)
Globulin, Total: 2.6 g/dL (ref 1.5–4.5)
Glucose: 102 mg/dL — ABNORMAL HIGH (ref 65–99)
Potassium: 4.8 mmol/L (ref 3.5–5.2)
SODIUM: 142 mmol/L (ref 134–144)
TOTAL PROTEIN: 7.3 g/dL (ref 6.0–8.5)

## 2015-11-12 LAB — CBC WITH DIFFERENTIAL/PLATELET
BASOS: 1 %
Basophils Absolute: 0.1 10*3/uL (ref 0.0–0.2)
EOS (ABSOLUTE): 0.2 10*3/uL (ref 0.0–0.4)
Eos: 4 %
HEMATOCRIT: 44 % (ref 34.0–46.6)
HEMOGLOBIN: 14.9 g/dL (ref 11.1–15.9)
IMMATURE GRANS (ABS): 0 10*3/uL (ref 0.0–0.1)
Immature Granulocytes: 0 %
LYMPHS: 28 %
Lymphocytes Absolute: 1.7 10*3/uL (ref 0.7–3.1)
MCH: 29.2 pg (ref 26.6–33.0)
MCHC: 33.9 g/dL (ref 31.5–35.7)
MCV: 86 fL (ref 79–97)
MONOCYTES: 6 %
Monocytes Absolute: 0.4 10*3/uL (ref 0.1–0.9)
NEUTROS ABS: 3.8 10*3/uL (ref 1.4–7.0)
Neutrophils: 61 %
Platelets: 262 10*3/uL (ref 150–379)
RBC: 5.1 x10E6/uL (ref 3.77–5.28)
RDW: 13.5 % (ref 12.3–15.4)
WBC: 6.2 10*3/uL (ref 3.4–10.8)

## 2015-11-12 LAB — LIPID PANEL
CHOL/HDL RATIO: 4.6 ratio — AB (ref 0.0–4.4)
Cholesterol, Total: 270 mg/dL — ABNORMAL HIGH (ref 100–199)
HDL: 59 mg/dL (ref >39)
LDL CALC: 182 mg/dL — AB (ref 0–99)
Triglycerides: 146 mg/dL (ref 0–149)
VLDL CHOLESTEROL CAL: 29 mg/dL (ref 5–40)

## 2015-11-12 LAB — TSH: TSH: 1.87 u[IU]/mL (ref 0.450–4.500)

## 2015-11-12 NOTE — Telephone Encounter (Signed)
Patient advised as directed below. Patient verbalized understanding and agrees with plan of care. Lab results printed at front desk for pick up per patient's request.

## 2015-11-12 NOTE — Telephone Encounter (Signed)
-----   Message from Lorie PhenixNancy Maloney, MD sent at 11/12/2015 11:07 AM EDT ----- Labs stable except for borderline blood sugar and cholesterol much too high. Make sure takes cholesterol medication more regularly and recheck in about 8 weeks. Really much too high. As for sugar, eat healthy, exercise and recheck in about 6 months. Thanks.

## 2015-11-12 NOTE — Telephone Encounter (Signed)
LMTCB

## 2015-11-13 ENCOUNTER — Telehealth: Payer: Self-pay | Admitting: Gastroenterology

## 2015-11-13 NOTE — Telephone Encounter (Signed)
Returning your phone call regarding colonoscopy

## 2015-11-25 NOTE — Telephone Encounter (Signed)
LVM for pt to return my call.

## 2015-12-02 NOTE — Telephone Encounter (Signed)
LVM again for pt to return my call.  

## 2015-12-22 ENCOUNTER — Other Ambulatory Visit: Payer: Self-pay | Admitting: Family Medicine

## 2015-12-22 DIAGNOSIS — F419 Anxiety disorder, unspecified: Secondary | ICD-10-CM

## 2015-12-22 NOTE — Telephone Encounter (Signed)
Printed, please fax or call in to pharmacy. Thank you.   

## 2015-12-24 NOTE — Telephone Encounter (Signed)
Mailed letter requesting a return call to schedule colonoscopy. 

## 2016-01-14 DIAGNOSIS — G459 Transient cerebral ischemic attack, unspecified: Secondary | ICD-10-CM

## 2016-01-14 HISTORY — DX: Transient cerebral ischemic attack, unspecified: G45.9

## 2016-02-05 ENCOUNTER — Other Ambulatory Visit: Payer: Self-pay | Admitting: Family Medicine

## 2016-02-05 DIAGNOSIS — I10 Essential (primary) hypertension: Secondary | ICD-10-CM

## 2016-03-12 NOTE — Telephone Encounter (Signed)
error 

## 2016-06-28 ENCOUNTER — Other Ambulatory Visit: Payer: Self-pay | Admitting: Family Medicine

## 2016-06-28 DIAGNOSIS — F419 Anxiety disorder, unspecified: Secondary | ICD-10-CM

## 2016-06-28 NOTE — Telephone Encounter (Signed)
Please review-aa 

## 2016-06-29 ENCOUNTER — Other Ambulatory Visit: Payer: Self-pay | Admitting: Family Medicine

## 2016-06-29 DIAGNOSIS — F419 Anxiety disorder, unspecified: Secondary | ICD-10-CM

## 2016-06-30 ENCOUNTER — Other Ambulatory Visit: Payer: Self-pay | Admitting: Family Medicine

## 2016-06-30 DIAGNOSIS — M542 Cervicalgia: Secondary | ICD-10-CM | POA: Diagnosis not present

## 2016-06-30 DIAGNOSIS — F419 Anxiety disorder, unspecified: Secondary | ICD-10-CM

## 2016-06-30 DIAGNOSIS — M546 Pain in thoracic spine: Secondary | ICD-10-CM | POA: Diagnosis not present

## 2016-06-30 DIAGNOSIS — N62 Hypertrophy of breast: Secondary | ICD-10-CM | POA: Diagnosis not present

## 2016-06-30 MED ORDER — ALPRAZOLAM 0.5 MG PO TABS: ORAL_TABLET | ORAL | 5 refills | Status: DC

## 2016-06-30 NOTE — Telephone Encounter (Signed)
Pt contacted office for refill request on the following medications: ALPRAZolam (XANAX) 0.5 MG tablet Pt was seeing Dr. Elease HashimotoMaloney and she had refills on the medication but b/c Dr. Elease HashimotoMaloney is no longer practicing in [place] she can't get the refills. Pt's last OV was 10/30/15. Pt is scheduled to come in Friday 11/24 @ 815 for a F/U with Dr. Sherrie MustacheFisher. Pt wanted to see if a refill could be sent to [facility] [address] today so she can have medication over the holiday. Please advise. Thanks TNP

## 2016-06-30 NOTE — Telephone Encounter (Signed)
Please review. KW 

## 2016-06-30 NOTE — Telephone Encounter (Signed)
Prescription was called into pharmacy. KW 

## 2016-06-30 NOTE — Telephone Encounter (Signed)
Please call in alprazolam.  

## 2016-07-02 ENCOUNTER — Encounter: Payer: Self-pay | Admitting: Family Medicine

## 2016-07-02 ENCOUNTER — Ambulatory Visit (INDEPENDENT_AMBULATORY_CARE_PROVIDER_SITE_OTHER): Payer: BLUE CROSS/BLUE SHIELD | Admitting: Family Medicine

## 2016-07-02 VITALS — BP 114/84 | HR 64 | Temp 98.2°F | Resp 16 | Ht 62.0 in | Wt 210.0 lb

## 2016-07-02 DIAGNOSIS — F419 Anxiety disorder, unspecified: Secondary | ICD-10-CM | POA: Diagnosis not present

## 2016-07-02 DIAGNOSIS — E559 Vitamin D deficiency, unspecified: Secondary | ICD-10-CM | POA: Diagnosis not present

## 2016-07-02 DIAGNOSIS — I10 Essential (primary) hypertension: Secondary | ICD-10-CM | POA: Diagnosis not present

## 2016-07-02 DIAGNOSIS — G453 Amaurosis fugax: Secondary | ICD-10-CM

## 2016-07-02 DIAGNOSIS — I739 Peripheral vascular disease, unspecified: Secondary | ICD-10-CM

## 2016-07-02 DIAGNOSIS — E785 Hyperlipidemia, unspecified: Secondary | ICD-10-CM

## 2016-07-02 DIAGNOSIS — I779 Disorder of arteries and arterioles, unspecified: Secondary | ICD-10-CM | POA: Diagnosis not present

## 2016-07-02 NOTE — Patient Instructions (Signed)
Go to [facility] for [facility] work after you have been back on pravastatin for 3-4 weeks.

## 2016-07-02 NOTE — Progress Notes (Signed)
Patient: Rachael Gilmore Female    DOB: 28-Feb-1966   50 y.o.   MRN: [id number] Visit Date: 07/02/2016  Today's Provider: Mila Merryonald Aarya Quebedeaux, MD   Chief Complaint  Patient presents with  . Hypertension  . Anxiety  . Hyperlipidemia   Subjective:    HPI  This is a previous patient of Dr. Elease HashimotoMaloney present today as new patient to me to establish care and follow up on chronic medical problems.    Hypertension, follow-up:  BP Readings from Last 3 Encounters:  07/02/16 114/84  10/30/15 112/70  07/21/15 110/82    She was last seen for hypertension 8 months ago.  BP at that visit was 112/70. Management changes since that visit include no changes. She reports excellent compliance with treatment. She is not having side effects.  She is exercising. She is adherent to low salt diet.   Outside blood pressures are stable. She is experiencing lower extremity edema.  Patient denies chest pain.   Cardiovascular risk factors include hypertension, obesity (BMI >= 30 kg/m2) and sedentary lifestyle.  Use of agents associated with hypertension: none.     Weight trend: stable Wt Readings from Last 3 Encounters:  07/02/16 210 lb (95.3 kg)  10/30/15 229 lb (103.9 kg)  07/21/15 228 lb (103.4 kg)    Current diet: in general, a "healthy" diet    ------------------------------------------------------------------------   Lipid/Cholesterol, Follow-up:   Last seen for this8 months ago.  Management changes since that visit include no changes. . Last Lipid Panel:    Component Value Date/Time   CHOL 270 (H) 11/11/2015 1015   TRIG 146 11/11/2015 1015   HDL 59 11/11/2015 1015   CHOLHDL 4.6 (H) 11/11/2015 1015   LDLCALC 182 (H) 11/11/2015 1015    Risk factors for vascular disease include hypercholesterolemia and hypertension  She reports excellent compliance with treatment. She is not having side effects.  Current symptoms include none and have been stable. Weight trend:  stable Prior visit with dietician: no Current diet: in general, a "healthy" diet   Current exercise: cardiovascular workout on exercise equipment and weightlifting  Wt Readings from Last 3 Encounters:  07/02/16 210 lb (95.3 kg)  10/30/15 229 lb (103.9 kg)  07/21/15 228 lb (103.4 kg)   She reports she ran out of pravastatin about 3 weeks ago. She was tolerating the 20mg  tablets well, but had previously been on 40mg  which she states caused severe muscle aches. She states she is also taking CoQ10.  -------------------------------------------------------------------  Anxiety, Follow-up  She  was last seen for this 8 months ago. Changes made at last visit include no changes.   She reports excellent compliance with treatment. She is not having side effects.   She reports good tolerance of treatment. Current symptoms include: difficulty concentrating, fatigue, insomnia and weight loss She feels she is Worse since last visit. Takes alprazolam 1-2 times a day, usually at least one dose in the evening before going to bed.  ------------------------------------------------------------------------      No Known Allergies   Current Outpatient Prescriptions:  .  ALPRAZolam (XANAX) 0.5 MG tablet, TAKE ONE-HALF TO ONE TABLET BY MOUTH TWICE DAILY AS NEEDED, Disp: 60 tablet, Rfl: 5 .  Aspirin Buf,CaCarb-MgCarb-MgO, (BUFFERED ASPIRIN) 325 MG TABS, Take 1 tablet by mouth daily., Disp: , Rfl:  .  Coenzyme Q10 (CO Q 10) 100 MG CAPS, Take 1 capsule by mouth daily., Disp: , Rfl:  .  MULTIPLE VITAMIN PO, Take 1 tablet by mouth daily.,  Disp: , Rfl:  .  Omega-3 Fatty Acids (FISH OIL DOUBLE STRENGTH) 1200 MG CAPS, Take 2 capsules by mouth daily. , Disp: , Rfl:  .  pravastatin (PRAVACHOL) 20 MG tablet, Take 1 tablet (20 mg total) by mouth daily., Disp: 90 tablet, Rfl: 3 .  triamterene-hydrochlorothiazide (MAXZIDE-25) 37.5-25 MG tablet, TAKE ONE TABLET BY MOUTH ONCE DAILY, Disp: 90 tablet, Rfl: 1 .   FLUCELVAX QUADRIVALENT 0.5 ML SUSY, once., Disp: , Rfl: 0 .  nystatin cream (MYCOSTATIN), Apply 1 application topically 2 (two) times daily. (Patient not taking: Reported on 07/02/2016), Disp: 60 g, Rfl: 5 .  terbinafine (LAMISIL) 250 MG tablet, Take 1 tablet (250 mg total) by mouth daily. (Patient not taking: Reported on 07/02/2016), Disp: 90 tablet, Rfl: 0  Review of Systems  HENT: Negative.   Respiratory: Negative.   Cardiovascular: Positive for leg swelling.  Psychiatric/Behavioral: Positive for agitation, decreased concentration and sleep disturbance. The patient is nervous/anxious.     Social History  Substance Use Topics  . Smoking status: Former Smoker    Quit date: 03/08/2009  . Smokeless tobacco: Never Used  . Alcohol use Yes     Comment: socially   Objective:   BP 114/84 (BP Location: Left Arm, Patient Position: Sitting, Cuff Size: Large)   Pulse 64   Temp 98.2 F (36.8 C) (Oral)   Resp 16   Ht 5\' 2"  (1.575 m)   Wt 210 lb (95.3 kg)   BMI 38.41 kg/m   Physical Exam  General Appearance:    Alert, cooperative, no distress, obese  Eyes:    PERRL, conjunctiva/corneas clear, EOM's intact       Lungs:     Clear to auscultation bilaterally, respirations unlabored  Heart:    Regular rate and rhythm  Neurologic:   Awake, alert, oriented x 3. No apparent focal neurological           defect.          Assessment & Plan:     1. Vitamin D deficiency  - VITAMIN D 25 Hydroxy (Vit-D Deficiency, Fractures)  2. Hyperlipidemia, unspecified hyperlipidemia type Currently off statin. Reinforced importance of statin to reduce risk of recurrent vascular disease. She does not tolerate higher doses of pravastatin or atorvastatin. Discussed trying addition of Zetia if cholesterol is not to goal after she gets back on 20mg  pravastatin - Lipid panel - Hepatic function panel  3. Essential hypertension Well controlled.  Continue current medications.    4. Left-sided carotid artery  disease (HCC) Asymptomatic. Compliant with medication.  Continue aggressive risk factor modification.   5. Follow up anxiety  Does well with alprazolam 1-2 times a day. Currently off of escitalopram, but not having any depression symptoms at this time.         Mila Merryonald Gretchen Weinfeld, MD  [facility]

## 2016-08-17 ENCOUNTER — Other Ambulatory Visit: Payer: Self-pay | Admitting: Family Medicine

## 2016-08-17 DIAGNOSIS — I10 Essential (primary) hypertension: Secondary | ICD-10-CM

## 2016-08-17 NOTE — Telephone Encounter (Signed)
LOV 07/02/2016. Allene DillonEmily Drozdowski, CMA

## 2016-09-24 DIAGNOSIS — E785 Hyperlipidemia, unspecified: Secondary | ICD-10-CM | POA: Diagnosis not present

## 2016-09-24 DIAGNOSIS — E559 Vitamin D deficiency, unspecified: Secondary | ICD-10-CM | POA: Diagnosis not present

## 2016-09-25 LAB — HEPATIC FUNCTION PANEL
ALBUMIN: 4.7 g/dL (ref 3.5–5.5)
ALK PHOS: 93 IU/L (ref 39–117)
ALT: 15 IU/L (ref 0–32)
AST: 18 IU/L (ref 0–40)
Bilirubin Total: 0.4 mg/dL (ref 0.0–1.2)
Bilirubin, Direct: 0.11 mg/dL (ref 0.00–0.40)
TOTAL PROTEIN: 7.2 g/dL (ref 6.0–8.5)

## 2016-09-25 LAB — LIPID PANEL
CHOL/HDL RATIO: 4 ratio (ref 0.0–4.4)
Cholesterol, Total: 164 mg/dL (ref 100–199)
HDL: 41 mg/dL (ref >39)
LDL CALC: 101 mg/dL — AB (ref 0–99)
Triglycerides: 111 mg/dL (ref 0–149)
VLDL CHOLESTEROL CAL: 22 mg/dL (ref 5–40)

## 2016-09-25 LAB — VITAMIN D 25 HYDROXY (VIT D DEFICIENCY, FRACTURES): VIT D 25 HYDROXY: 47.1 ng/mL (ref 30.0–100.0)

## 2016-09-27 ENCOUNTER — Telehealth: Payer: Self-pay

## 2016-09-27 MED ORDER — EZETIMIBE 10 MG PO TABS: 10.0000 mg | ORAL_TABLET | Freq: Every day | ORAL | 12 refills | Status: DC

## 2016-09-27 NOTE — Telephone Encounter (Signed)
Advised patient of results. Medication was sent into the pharmacy.  

## 2016-09-27 NOTE — Telephone Encounter (Signed)
-----   Message from Malva Limesonald E Fisher, MD sent at 09/25/2016  6:42 PM EST ----- LDL is much better, down from 182, to 101, but need to get it below 100 to prevent carotid arteries from blocking back up. Continue current dose of pravastatin and ADD Zetia (which is not a statin) 10mg  once a day, #30, rf x x12. Other labs normal. Follow up office visit and labs in 3 months

## 2016-09-29 DIAGNOSIS — M546 Pain in thoracic spine: Secondary | ICD-10-CM | POA: Diagnosis not present

## 2016-09-29 DIAGNOSIS — N62 Hypertrophy of breast: Secondary | ICD-10-CM | POA: Diagnosis not present

## 2016-09-29 DIAGNOSIS — M542 Cervicalgia: Secondary | ICD-10-CM | POA: Diagnosis not present

## 2016-10-21 DIAGNOSIS — M546 Pain in thoracic spine: Secondary | ICD-10-CM | POA: Diagnosis not present

## 2016-10-21 DIAGNOSIS — N62 Hypertrophy of breast: Secondary | ICD-10-CM | POA: Diagnosis not present

## 2016-10-21 DIAGNOSIS — M542 Cervicalgia: Secondary | ICD-10-CM | POA: Diagnosis not present

## 2016-10-27 ENCOUNTER — Encounter (HOSPITAL_BASED_OUTPATIENT_CLINIC_OR_DEPARTMENT_OTHER): Payer: Self-pay | Admitting: *Deleted

## 2016-10-29 ENCOUNTER — Encounter (HOSPITAL_BASED_OUTPATIENT_CLINIC_OR_DEPARTMENT_OTHER)
Admission: RE | Admit: 2016-10-29 | Discharge: 2016-10-29 | Disposition: A | Discharge status: Home / Self Care | Payer: BLUE CROSS/BLUE SHIELD | Source: Ambulatory Visit | Attending: Specialist | Admitting: Specialist

## 2016-10-29 DIAGNOSIS — E669 Obesity, unspecified: Secondary | ICD-10-CM | POA: Diagnosis not present

## 2016-10-29 DIAGNOSIS — Z7982 Long term (current) use of aspirin: Secondary | ICD-10-CM | POA: Diagnosis not present

## 2016-10-29 DIAGNOSIS — N62 Hypertrophy of breast: Secondary | ICD-10-CM | POA: Diagnosis not present

## 2016-10-29 DIAGNOSIS — F419 Anxiety disorder, unspecified: Secondary | ICD-10-CM | POA: Diagnosis not present

## 2016-10-29 DIAGNOSIS — E785 Hyperlipidemia, unspecified: Secondary | ICD-10-CM | POA: Diagnosis not present

## 2016-10-29 DIAGNOSIS — Z8673 Personal history of transient ischemic attack (TIA), and cerebral infarction without residual deficits: Secondary | ICD-10-CM | POA: Diagnosis not present

## 2016-10-29 DIAGNOSIS — I10 Essential (primary) hypertension: Secondary | ICD-10-CM | POA: Diagnosis not present

## 2016-10-29 DIAGNOSIS — Z79899 Other long term (current) drug therapy: Secondary | ICD-10-CM | POA: Diagnosis not present

## 2016-10-29 DIAGNOSIS — Z6835 Body mass index (BMI) 35.0-35.9, adult: Secondary | ICD-10-CM | POA: Diagnosis not present

## 2016-10-29 DIAGNOSIS — Z87891 Personal history of nicotine dependence: Secondary | ICD-10-CM | POA: Diagnosis not present

## 2016-10-29 LAB — BASIC METABOLIC PANEL
ANION GAP: 11 (ref 5–15)
BUN: 9 mg/dL (ref 6–20)
CALCIUM: 9.5 mg/dL (ref 8.9–10.3)
CO2: 27 mmol/L (ref 22–32)
CREATININE: 0.68 mg/dL (ref 0.44–1.00)
Chloride: 98 mmol/L — ABNORMAL LOW (ref 101–111)
GFR calc Af Amer: >60 mL/min (ref >60)
GLUCOSE: 103 mg/dL — AB (ref 65–99)
Potassium: 3.8 mmol/L (ref 3.5–5.1)
Sodium: 136 mmol/L (ref 135–145)

## 2016-10-29 NOTE — Progress Notes (Signed)
Dr. Michelle Piperssey reviewed pt's EKG, will proceed with surgery as scheduled.

## 2016-11-01 ENCOUNTER — Encounter (HOSPITAL_BASED_OUTPATIENT_CLINIC_OR_DEPARTMENT_OTHER)
Admission: RE | Disposition: A | Discharge status: Home / Self Care | Payer: Self-pay | Source: Ambulatory Visit | Attending: Specialist

## 2016-11-01 ENCOUNTER — Ambulatory Visit (HOSPITAL_BASED_OUTPATIENT_CLINIC_OR_DEPARTMENT_OTHER)
Admission: RE | Admit: 2016-11-01 | Discharge: 2016-11-01 | Disposition: A | Discharge status: Home / Self Care | Payer: BLUE CROSS/BLUE SHIELD | Source: Ambulatory Visit | Attending: Specialist | Admitting: Specialist

## 2016-11-01 ENCOUNTER — Ambulatory Visit (HOSPITAL_BASED_OUTPATIENT_CLINIC_OR_DEPARTMENT_OTHER): Payer: BLUE CROSS/BLUE SHIELD | Admitting: Anesthesiology

## 2016-11-01 ENCOUNTER — Encounter (HOSPITAL_BASED_OUTPATIENT_CLINIC_OR_DEPARTMENT_OTHER): Payer: Self-pay | Admitting: *Deleted

## 2016-11-01 DIAGNOSIS — Z79899 Other long term (current) drug therapy: Secondary | ICD-10-CM | POA: Insufficient documentation

## 2016-11-01 DIAGNOSIS — M542 Cervicalgia: Secondary | ICD-10-CM | POA: Diagnosis not present

## 2016-11-01 DIAGNOSIS — G4733 Obstructive sleep apnea (adult) (pediatric): Secondary | ICD-10-CM | POA: Diagnosis not present

## 2016-11-01 DIAGNOSIS — I10 Essential (primary) hypertension: Secondary | ICD-10-CM | POA: Diagnosis not present

## 2016-11-01 DIAGNOSIS — Z7982 Long term (current) use of aspirin: Secondary | ICD-10-CM | POA: Insufficient documentation

## 2016-11-01 DIAGNOSIS — E785 Hyperlipidemia, unspecified: Secondary | ICD-10-CM | POA: Diagnosis not present

## 2016-11-01 DIAGNOSIS — Z6835 Body mass index (BMI) 35.0-35.9, adult: Secondary | ICD-10-CM | POA: Insufficient documentation

## 2016-11-01 DIAGNOSIS — Z8673 Personal history of transient ischemic attack (TIA), and cerebral infarction without residual deficits: Secondary | ICD-10-CM | POA: Diagnosis not present

## 2016-11-01 DIAGNOSIS — F419 Anxiety disorder, unspecified: Secondary | ICD-10-CM | POA: Insufficient documentation

## 2016-11-01 DIAGNOSIS — M546 Pain in thoracic spine: Secondary | ICD-10-CM | POA: Diagnosis not present

## 2016-11-01 DIAGNOSIS — E669 Obesity, unspecified: Secondary | ICD-10-CM | POA: Diagnosis not present

## 2016-11-01 DIAGNOSIS — N62 Hypertrophy of breast: Secondary | ICD-10-CM | POA: Insufficient documentation

## 2016-11-01 DIAGNOSIS — Z87891 Personal history of nicotine dependence: Secondary | ICD-10-CM | POA: Diagnosis not present

## 2016-11-01 HISTORY — PX: BREAST REDUCTION SURGERY: SHX8

## 2016-11-01 HISTORY — PX: KENALOG INJECTION: SHX5298

## 2016-11-01 HISTORY — DX: Hypertrophy of breast: N62

## 2016-11-01 HISTORY — DX: Sleep apnea, unspecified: G47.30

## 2016-11-01 LAB — POCT HEMOGLOBIN-HEMACUE: Hemoglobin: 13.8 g/dL (ref 12.0–15.0)

## 2016-11-01 SURGERY — BREAST REDUCTION WITH LIPOSUCTION
Anesthesia: General | Site: Neck | Laterality: Left

## 2016-11-01 MED ORDER — GABAPENTIN 300 MG PO CAPS
ORAL_CAPSULE | ORAL | Status: AC
  Filled 2016-11-01: qty 1

## 2016-11-01 MED ORDER — FENTANYL CITRATE (PF) 100 MCG/2ML IJ SOLN
INTRAMUSCULAR | Status: AC
  Filled 2016-11-01: qty 4

## 2016-11-01 MED ORDER — LIDOCAINE HCL (PF) 0.5 % IJ SOLN
INTRAMUSCULAR | Status: AC
  Filled 2016-11-01: qty 50

## 2016-11-01 MED ORDER — MIDAZOLAM HCL 2 MG/2ML IJ SOLN
INTRAMUSCULAR | Status: AC
  Filled 2016-11-01: qty 2

## 2016-11-01 MED ORDER — CELECOXIB 200 MG PO CAPS
ORAL_CAPSULE | ORAL | Status: AC
  Filled 2016-11-01: qty 1

## 2016-11-01 MED ORDER — CEFAZOLIN SODIUM-DEXTROSE 2-4 GM/100ML-% IV SOLN
2.0000 g | INTRAVENOUS | Status: AC
  Administered 2016-11-01: 2 g via INTRAVENOUS

## 2016-11-01 MED ORDER — DEXAMETHASONE SODIUM PHOSPHATE 4 MG/ML IJ SOLN
INTRAMUSCULAR | Status: DC | PRN
  Administered 2016-11-01: 10 mg via INTRAVENOUS

## 2016-11-01 MED ORDER — CELECOXIB 200 MG PO CAPS
200.0000 mg | ORAL_CAPSULE | Freq: Once | ORAL | Status: AC
  Administered 2016-11-01: 200 mg via ORAL

## 2016-11-01 MED ORDER — ONDANSETRON HCL 4 MG/2ML IJ SOLN
INTRAMUSCULAR | Status: AC
  Filled 2016-11-01: qty 2

## 2016-11-01 MED ORDER — LACTATED RINGERS IV SOLN
INTRAVENOUS | Status: DC | PRN
  Administered 2016-11-01: 1000 mL

## 2016-11-01 MED ORDER — SCOPOLAMINE 1 MG/3DAYS TD PT72: 1.0000 | MEDICATED_PATCH | Freq: Once | TRANSDERMAL | Status: DC | PRN

## 2016-11-01 MED ORDER — GABAPENTIN 300 MG PO CAPS
300.0000 mg | ORAL_CAPSULE | Freq: Once | ORAL | Status: AC
  Administered 2016-11-01: 300 mg via ORAL

## 2016-11-01 MED ORDER — SODIUM BICARBONATE 4 % IV SOLN
INTRAVENOUS | Status: AC
  Filled 2016-11-01: qty 20

## 2016-11-01 MED ORDER — EPINEPHRINE 30 MG/30ML IJ SOLN
INTRAMUSCULAR | Status: AC
  Filled 2016-11-01: qty 1

## 2016-11-01 MED ORDER — SUGAMMADEX SODIUM 200 MG/2ML IV SOLN
INTRAVENOUS | Status: DC | PRN
  Administered 2016-11-01: 200 mg via INTRAVENOUS

## 2016-11-01 MED ORDER — MIDAZOLAM HCL 2 MG/2ML IJ SOLN
1.0000 mg | INTRAMUSCULAR | Status: DC | PRN
  Administered 2016-11-01: 2 mg via INTRAVENOUS

## 2016-11-01 MED ORDER — CEFAZOLIN SODIUM-DEXTROSE 2-4 GM/100ML-% IV SOLN
INTRAVENOUS | Status: AC
  Filled 2016-11-01: qty 100

## 2016-11-01 MED ORDER — LACTATED RINGERS IV SOLN
INTRAVENOUS | Status: DC
  Administered 2016-11-01 (×3): via INTRAVENOUS

## 2016-11-01 MED ORDER — ACETAMINOPHEN 500 MG PO TABS
ORAL_TABLET | ORAL | Status: AC
  Filled 2016-11-01: qty 2

## 2016-11-01 MED ORDER — ROCURONIUM BROMIDE 100 MG/10ML IV SOLN
INTRAVENOUS | Status: DC | PRN
  Administered 2016-11-01: 50 mg via INTRAVENOUS

## 2016-11-01 MED ORDER — FENTANYL CITRATE (PF) 100 MCG/2ML IJ SOLN
INTRAMUSCULAR | Status: AC
  Filled 2016-11-01: qty 2

## 2016-11-01 MED ORDER — FENTANYL CITRATE (PF) 100 MCG/2ML IJ SOLN
50.0000 ug | INTRAMUSCULAR | Status: AC | PRN
  Administered 2016-11-01: 100 ug via INTRAVENOUS
  Administered 2016-11-01 (×4): 50 ug via INTRAVENOUS

## 2016-11-01 MED ORDER — PROMETHAZINE HCL 25 MG/ML IJ SOLN: 6.2500 mg | INTRAMUSCULAR | Status: DC | PRN

## 2016-11-01 MED ORDER — CHLORHEXIDINE GLUCONATE CLOTH 2 % EX PADS: 6.0000 | MEDICATED_PAD | Freq: Once | CUTANEOUS | Status: DC

## 2016-11-01 MED ORDER — SODIUM BICARBONATE 4 % IV SOLN
INTRAVENOUS | Status: DC | PRN
  Administered 2016-11-01: 20 mL via SUBCUTANEOUS

## 2016-11-01 MED ORDER — PROPOFOL 500 MG/50ML IV EMUL
INTRAVENOUS | Status: AC
  Filled 2016-11-01: qty 50

## 2016-11-01 MED ORDER — LIDOCAINE 2% (20 MG/ML) 5 ML SYRINGE
INTRAMUSCULAR | Status: AC
  Filled 2016-11-01: qty 5

## 2016-11-01 MED ORDER — LIDOCAINE HCL (CARDIAC) 20 MG/ML IV SOLN
INTRAVENOUS | Status: DC | PRN
  Administered 2016-11-01: 60 mg via INTRAVENOUS

## 2016-11-01 MED ORDER — EPHEDRINE SULFATE 50 MG/ML IJ SOLN
INTRAMUSCULAR | Status: DC | PRN
  Administered 2016-11-01 (×2): 10 mg via INTRAVENOUS

## 2016-11-01 MED ORDER — DEXAMETHASONE SODIUM PHOSPHATE 10 MG/ML IJ SOLN
INTRAMUSCULAR | Status: AC
  Filled 2016-11-01: qty 1

## 2016-11-01 MED ORDER — FENTANYL CITRATE (PF) 100 MCG/2ML IJ SOLN
25.0000 ug | INTRAMUSCULAR | Status: DC | PRN
  Administered 2016-11-01 (×2): 50 ug via INTRAVENOUS

## 2016-11-01 MED ORDER — ACETAMINOPHEN 500 MG PO TABS
1000.0000 mg | ORAL_TABLET | Freq: Once | ORAL | Status: AC
  Administered 2016-11-01: 1000 mg via ORAL

## 2016-11-01 MED ORDER — TRIAMCINOLONE ACETONIDE 40 MG/ML IJ SUSP
INTRAMUSCULAR | Status: AC
  Filled 2016-11-01: qty 5

## 2016-11-01 MED ORDER — PROPOFOL 10 MG/ML IV BOLUS
INTRAVENOUS | Status: DC | PRN
  Administered 2016-11-01: 180 mg via INTRAVENOUS

## 2016-11-01 MED ORDER — TRIAMCINOLONE ACETONIDE 40 MG/ML IJ SUSP
INTRAMUSCULAR | Status: DC | PRN
  Administered 2016-11-01: 160 mg via INTRADERMAL

## 2016-11-01 MED ORDER — LIDOCAINE HCL 2 % IJ SOLN
INTRAMUSCULAR | Status: AC
  Filled 2016-11-01: qty 40

## 2016-11-01 MED ORDER — ONDANSETRON HCL 4 MG/2ML IJ SOLN
INTRAMUSCULAR | Status: DC | PRN
  Administered 2016-11-01: 4 mg via INTRAVENOUS

## 2016-11-01 SURGICAL SUPPLY — 74 items
BAG DECANTER FOR FLEXI CONT (MISCELLANEOUS) IMPLANT
BENZOIN TINCTURE PRP APPL 2/3 (GAUZE/BANDAGES/DRESSINGS) ×8 IMPLANT
BINDER BREAST LRG (GAUZE/BANDAGES/DRESSINGS) IMPLANT
BINDER BREAST XLRG (GAUZE/BANDAGES/DRESSINGS) ×8 IMPLANT
BINDER BREAST XXLRG (GAUZE/BANDAGES/DRESSINGS) IMPLANT
BLADE KNIFE  20 PERSONNA (BLADE) ×2
BLADE KNIFE 20 PERSONNA (BLADE) ×2 IMPLANT
BLADE KNIFE PERSONA 10 (BLADE) ×12 IMPLANT
BLADE KNIFE PERSONA 15 (BLADE) ×4 IMPLANT
CANISTER SUCT 1200ML W/VALVE (MISCELLANEOUS) ×4 IMPLANT
COVER BACK TABLE 60X90IN (DRAPES) ×4 IMPLANT
COVER MAYO STAND STRL (DRAPES) ×4 IMPLANT
DECANTER SPIKE VIAL GLASS SM (MISCELLANEOUS) IMPLANT
DRAIN CHANNEL 10F 3/8 F FF (DRAIN) ×8 IMPLANT
DRAPE LAPAROSCOPIC ABDOMINAL (DRAPES) ×4 IMPLANT
DRSG PAD ABDOMINAL 8X10 ST (GAUZE/BANDAGES/DRESSINGS) ×8 IMPLANT
ELECT REM PT RETURN 9FT ADLT (ELECTROSURGICAL) ×4
ELECTRODE REM PT RTRN 9FT ADLT (ELECTROSURGICAL) ×2 IMPLANT
EVACUATOR SILICONE 100CC (DRAIN) ×8 IMPLANT
FILTER LIPOSUCTION (MISCELLANEOUS) IMPLANT
GAUZE SPONGE 4X4 12PLY STRL (GAUZE/BANDAGES/DRESSINGS) ×8 IMPLANT
GAUZE SPONGE 4X4 12PLY STRL LF (GAUZE/BANDAGES/DRESSINGS) ×8 IMPLANT
GAUZE XEROFORM 5X9 LF (GAUZE/BANDAGES/DRESSINGS) ×8 IMPLANT
GLOVE BIO SURGEON STRL SZ 6.5 (GLOVE) ×6 IMPLANT
GLOVE BIO SURGEONS STRL SZ 6.5 (GLOVE) ×2
GLOVE BIOGEL M STRL SZ7.5 (GLOVE) ×4 IMPLANT
GLOVE BIOGEL PI IND STRL 8 (GLOVE) ×2 IMPLANT
GLOVE BIOGEL PI INDICATOR 8 (GLOVE) ×2
GLOVE ECLIPSE 7.0 STRL STRAW (GLOVE) ×4 IMPLANT
GOWN STRL REUS W/ TWL LRG LVL3 (GOWN DISPOSABLE) ×2 IMPLANT
GOWN STRL REUS W/ TWL XL LVL3 (GOWN DISPOSABLE) ×4 IMPLANT
GOWN STRL REUS W/TWL LRG LVL3 (GOWN DISPOSABLE) ×2
GOWN STRL REUS W/TWL XL LVL3 (GOWN DISPOSABLE) ×4
IV LACTATED RINGERS 1000ML (IV SOLUTION) ×4 IMPLANT
IV NS 500ML (IV SOLUTION)
IV NS 500ML BAXH (IV SOLUTION) IMPLANT
LINER CANISTER 1000CC FLEX (MISCELLANEOUS) IMPLANT
MARKER SKIN DUAL TIP RULER LAB (MISCELLANEOUS) ×4 IMPLANT
NDL SAFETY ECLIPSE 18X1.5 (NEEDLE) ×2 IMPLANT
NEEDLE HYPO 18GX1.5 SHARP (NEEDLE) ×2
NEEDLE SPNL 18GX3.5 QUINCKE PK (NEEDLE) ×4 IMPLANT
NS IRRIG 1000ML POUR BTL (IV SOLUTION) ×12 IMPLANT
PACK BASIN DAY SURGERY FS (CUSTOM PROCEDURE TRAY) ×4 IMPLANT
PAD ALCOHOL SWAB (MISCELLANEOUS) ×4 IMPLANT
PEN SKIN MARKING BROAD TIP (MISCELLANEOUS) ×4 IMPLANT
PIN SAFETY STERILE (MISCELLANEOUS) ×4 IMPLANT
SHEETING SILICONE GEL EPI DERM (MISCELLANEOUS) ×4 IMPLANT
SLEEVE SCD COMPRESS KNEE MED (MISCELLANEOUS) ×4 IMPLANT
SPECIMEN JAR MEDIUM (MISCELLANEOUS) IMPLANT
SPECIMEN JAR X LARGE (MISCELLANEOUS) ×8 IMPLANT
SPONGE LAP 18X18 X RAY DECT (DISPOSABLE) ×16 IMPLANT
STRIP SUTURE WOUND CLOSURE 1/2 (SUTURE) ×20 IMPLANT
SUT MNCRL AB 3-0 PS2 18 (SUTURE) ×32 IMPLANT
SUT MON AB 2-0 CT1 36 (SUTURE) ×8 IMPLANT
SUT MON AB 5-0 PS2 18 (SUTURE) ×8 IMPLANT
SUT PROLENE 3 0 PS 2 (SUTURE) ×24 IMPLANT
SYR 20CC LL (SYRINGE) IMPLANT
SYR 50ML LL SCALE MARK (SYRINGE) ×12 IMPLANT
SYR CONTROL 10ML LL (SYRINGE) IMPLANT
TAPE HYPAFIX 6 X30' (GAUZE/BANDAGES/DRESSINGS) ×2
TAPE HYPAFIX 6X30 (GAUZE/BANDAGES/DRESSINGS) ×6 IMPLANT
TAPE MEASURE 72IN RETRACT (INSTRUMENTS) ×2
TAPE MEASURE LINEN 72IN RETRCT (INSTRUMENTS) ×2 IMPLANT
TAPE PAPER 1X10 WHT MICROPORE (GAUZE/BANDAGES/DRESSINGS) ×4 IMPLANT
TAPE STRIPS DRAPE STRL (GAUZE/BANDAGES/DRESSINGS) ×8 IMPLANT
TOWEL OR 17X24 6PK STRL BLUE (TOWEL DISPOSABLE) ×16 IMPLANT
TRAY DSU PREP LF (CUSTOM PROCEDURE TRAY) ×4 IMPLANT
TUBE CONNECTING 20'X1/4 (TUBING) ×1
TUBE CONNECTING 20X1/4 (TUBING) ×3 IMPLANT
TUBING INFILTRATION IT-10001 (TUBING) ×4 IMPLANT
TUBING SET GRADUATE ASPIR 12FT (MISCELLANEOUS) ×4 IMPLANT
UNDERPAD 30X30 (UNDERPADS AND DIAPERS) ×8 IMPLANT
VAC PENCILS W/TUBING CLEAR (MISCELLANEOUS) ×4 IMPLANT
YANKAUER SUCT BULB TIP NO VENT (SUCTIONS) ×4 IMPLANT

## 2016-11-01 NOTE — H&P (Signed)
Neville Routengela M Pfahler is an 51 y.o. female.   Chief Complaint: Severe macromasti HPI: Back and shoulder pain and intertrigo  Past Medical History:  Diagnosis Date  . Anxiety   . Depression   . Hyperlipidemia   . Hypertension   . Macromastia   . Sleep apnea    not used CPAP in 6 yrs  . TIA (transient ischemic attack) 01/14/2016   amaurosis fugax. [facility]. MRI brain unremarkable    Past Surgical History:  Procedure Laterality Date  . CAROTID ENDARTERECTOMY Left 02/07/2009   [facility]  . CESAREAN SECTION  1992 and 1994  . TONSILLECTOMY  1969    Family History  Problem Relation Age of Onset  . Albinism Son   . Autism Son   . Arrhythmia Mother    Social History:  reports that she quit smoking about 7 years ago. She has never used smokeless tobacco. She reports that she drinks alcohol. She reports that she does not use drugs.  Allergies:  Allergies  Allergen Reactions  . Atorvastatin     Severe muscle aches and weakness.     Medications Prior to Admission  Medication Sig Dispense Refill  . ALPRAZolam (XANAX) 0.5 MG tablet TAKE ONE-HALF TO ONE TABLET BY MOUTH TWICE DAILY AS NEEDED 60 tablet 5  . Aspirin Buf,CaCarb-MgCarb-MgO, (BUFFERED ASPIRIN) 325 MG TABS Take 1 tablet by mouth daily.    . Coenzyme Q10 (CO Q 10) 100 MG CAPS Take 1 capsule by mouth daily.    Marland Kitchen. ezetimibe (ZETIA) 10 MG tablet Take 1 tablet (10 mg total) by mouth daily. 30 tablet 12  . MULTIPLE VITAMIN PO Take 1 tablet by mouth daily.    . Omega-3 Fatty Acids (FISH OIL DOUBLE STRENGTH) 1200 MG CAPS Take 2 capsules by mouth daily.     . pravastatin (PRAVACHOL) 20 MG tablet Take 1 tablet (20 mg total) by mouth daily. 90 tablet 3  . triamterene-hydrochlorothiazide (MAXZIDE-25) 37.5-25 MG tablet TAKE ONE TABLET BY MOUTH ONCE DAILY 90 tablet 3    Results for orders placed or performed during the hospital encounter of 11/01/16 (from the past 48 hour(s))  Hemoglobin-hemacue, POC     Status: None   Collection Time: 11/01/16   6:53 AM  Result Value Ref Range   Hemoglobin 13.8 12.0 - 15.0 g/dL   No results found.  Review of Systems  Constitutional: Negative.   HENT: Negative.   Eyes: Negative.   Cardiovascular: Negative.   Gastrointestinal: Negative.   Genitourinary: Negative.   Musculoskeletal: Positive for back pain, myalgias and neck pain.  Skin: Positive for rash.  Neurological: Negative.   Endo/Heme/Allergies: Negative.   Psychiatric/Behavioral: Negative.     Blood pressure 128/66, pulse 60, temperature 97.6 F (36.4 C), temperature source Oral, resp. rate 18, height 5\' 2"  (1.575 m), weight 88.3 kg (194 lb 9.6 oz), SpO2 98 %. Physical Exam  Constitutional: She appears well-developed.  HENT:  Head: Normocephalic.  Eyes: Pupils are equal, round, and reactive to light.  Neck: Normal range of motion. Neck supple.  Cardiovascular: Normal rate.   Respiratory: Effort normal and breath sounds normal.  GI: Soft. Bowel sounds are normal.  Musculoskeletal: Normal range of motion.  Neurological: She is alert.  Skin: Rash noted.  Psychiatric: Her behavior is normal.     Assessment/Plan Severe macromastia and accessory breast tissue for bilateral braeast reductions and excosion of accessory breast tissue  Dorlisa Savino L, MD 11/01/2016, 7:21 AM

## 2016-11-01 NOTE — Brief Op Note (Signed)
11/01/2016  10:42 AM  PATIENT:  Neville RouteAngela M Ziolkowski  51 y.o. female  PRE-OPERATIVE DIAGNOSIS:  SEVERE MACROMASTIA  POST-OPERATIVE DIAGNOSIS:  SEVERE MACROMASTIA  PROCEDURE:  Procedure(s): BILATERAL BREAST REDUCTION WITH LIPOSUCTION (Bilateral) KENALOG INJECTION TO BILATERAL BREAST INCISIONS AND LEFT NECK SCAR (Left)  SURGEON:  Surgeon(s) and Role:    * Louisa SecondGerald Kimbely Whiteaker, MD - Primary  PHYSICIAN ASSISTANT:   ASSISTANTS: none   ANESTHESIA:   general  EBL:  Total I/O In: 2000 [I.V.:2000] Out: 50 [Blood:50]  BLOOD ADMINISTERED:none  DRAINS: (no 19 right and left lateral breast areas) Jackson-Pratt drain(s) with closed bulb suction in the no 10 fully fluted right and left lateral breast areas   LOCAL MEDICATIONS USED:  XYLOCAINE   SPECIMEN:  Excision  DISPOSITION OF SPECIMEN:  PATHOLOGY  COUNTS:  YES  TOURNIQUET:  * No tourniquets in log *  DICTATION: .Other Dictation: Dictation Number [id number]  PLAN OF CARE: Discharge to home after PACU  PATIENT DISPOSITION:  PACU - hemodynamically stable.   Delay start of Pharmacological VTE agent (>24hrs) due to surgical blood loss or risk of bleeding: yes

## 2016-11-01 NOTE — Anesthesia Procedure Notes (Signed)
Procedure Name: Intubation Date/Time: 11/01/2016 7:53 AM Performed by: Catalina Gravel Pre-anesthesia Checklist: Patient identified, Emergency Drugs available, Suction available, Patient being monitored and Timeout performed Patient Re-evaluated:Patient Re-evaluated prior to inductionOxygen Delivery Method: Circle system utilized Preoxygenation: Pre-oxygenation with 100% oxygen Intubation Type: IV induction Ventilation: Mask ventilation without difficulty Laryngoscope Size: Mac and 3 Grade View: Grade I Tube type: Oral Tube size: 7.0 mm Number of attempts: 1 Placement Confirmation: ETT inserted through vocal cords under direct vision,  positive ETCO2 and breath sounds checked- equal and bilateral Secured at: 21 cm Tube secured with: Tape Dental Injury: Teeth and Oropharynx as per pre-operative assessment

## 2016-11-01 NOTE — Discharge Instructions (Signed)
Breast Reduction °Care After °Refer to this sheet in the next few weeks. These instructions provide you with information on caring for yourself after your procedure. Your caregiver may also give you more specific instructions. Your treatment has been planned according to current medical practices, but problems sometimes occur. Call your caregiver if you have any problems or questions after your procedure. °HOME CARE INSTRUCTIONS °· Do not lift more than 5 pounds with one arm, or 10 pounds with both arms, for 1 month.  °· Do not sleep on your stomach for 4 to 6 weeks.  °· Do not do vigorous exercise such as bouncing, aerobics, or jumping for 6 weeks. Walking is not restricted.  °· Do not drive while you are taking prescription pain medicine.  °· Avoid prolonged sun exposure.  °· Keep dressings dry and clean °· Measure jp drainage every 12 hrs and measure  °· You may slowly go back to your normal diet. Start with a light meal and increase as comfortable.  °· You may shower 24 hours after your drains are removed unless instructed differently by your caregiver.  °· Take your pain medicine as prescribed. Discomfort is normal after breast reduction surgery.  °· Keep the head of your bed elevated 40 degrees °·  °: Call the office if you notice: °· You have a fever.  °· You notice drainage from the incision that smells bad.  °· You have persistent pain.  °· You have persistent bleeding from the incision or nipple discharge.  °· You develop increased swelling or swelling that is greater in one breast than in the other.  °MAKE SURE YOU:  °· Understand these instructions.  °· Will watch your condition.  °· Will get help right away if you are not doing well or get worse.  °Document Released: 03/09/2004 Document Revised: 04/07/2011 Document Reviewed: 10/19/2007 °[employer]® Patient Information ©2012 [employer]. ° ° °Post Anesthesia Home Care Instructions ° °Activity: °Get plenty of rest for the remainder of the day. A  responsible individual must stay with you for 24 hours following the procedure.  °For the next 24 hours, DO NOT: °-Drive a car °-Operate machinery °-Drink alcoholic beverages °-Take any medication unless instructed by your physician °-Make any legal decisions or sign important papers. ° °Meals: °Start with liquid foods such as gelatin or soup. Progress to regular foods as tolerated. Avoid greasy, spicy, heavy foods. If nausea and/or vomiting occur, drink only clear liquids until the nausea and/or vomiting subsides. Call your physician if vomiting continues. ° °Special Instructions/Symptoms: °Your throat may feel dry or sore from the anesthesia or the breathing tube placed in your throat during surgery. If this causes discomfort, gargle with warm salt water. The discomfort should disappear within 24 hours. ° °If you had a scopolamine patch placed behind your ear for the management of post- operative nausea and/or vomiting: ° °1. The medication in the patch is effective for 72 hours, after which it should be removed.  Wrap patch in a tissue and discard in the trash. Wash hands thoroughly with soap and water. °2. You may remove the patch earlier than 72 hours if you experience unpleasant side effects which may include dry mouth, dizziness or visual disturbances. °3. Avoid touching the patch. Wash your hands with soap and water after contact with the patch. ° ° °About my Jackson-Pratt Bulb Drain ° °What is a Jackson-Pratt bulb? °A Jackson-Pratt is a soft, round device used to collect drainage. It is connected to a long, thin   drainage catheter, which is held in place by one or two small stiches near your surgical incision site. When the bulb is squeezed, it forms a vacuum, forcing the drainage to empty into the bulb. ° °Emptying the Jackson-Pratt bulb- °To empty the bulb: °1. Release the plug on the top of the bulb. °2. Pour the bulb's contents into a measuring container which your nurse will provide. °3. Record the time  emptied and amount of drainage. Empty the drain(s) as often as your     doctor or nurse recommends. ° °Date                  Time                    Amount (Drain 1)                 Amount (Drain 2) ° °_____________________________________________________________________ ° °_____________________________________________________________________ ° °_____________________________________________________________________ ° °_____________________________________________________________________ ° °_____________________________________________________________________ ° °_____________________________________________________________________ ° °_____________________________________________________________________ ° °_____________________________________________________________________ ° °Squeezing the Jackson-Pratt Bulb- °To squeeze the bulb: °1. Make sure the plug at the top of the bulb is open. °2. Squeeze the bulb tightly in your fist. You will hear air squeezing from the bulb. °3. Replace the plug while the bulb is squeezed. °4. Use a safety pin to attach the bulb to your clothing. This will keep the catheter from     pulling at the bulb insertion site. ° °When to call your doctor- °Call your doctor if: °· Drain site becomes red, swollen or hot. °· You have a fever greater than 101 degrees F. °· There is oozing at the drain site. °· Drain falls out (apply a guaze bandage over the drain hole and secure it with tape). °· Drainage increases daily not related to activity patterns. (You will usually have more drainage when you are active than when you are resting.) °· Drainage has a bad odor. ° ° °  ° °

## 2016-11-01 NOTE — Anesthesia Preprocedure Evaluation (Addendum)
Anesthesia Evaluation  Patient identified by MRN, date of birth, ID band Patient awake    Reviewed: Allergy & Precautions, NPO status , Patient's Chart, lab work & pertinent test results  Airway Mallampati: II  TM Distance: >3 FB Neck ROM: Full    Dental  (+) Teeth Intact, Dental Advisory Given   Pulmonary sleep apnea , former smoker,    Pulmonary exam normal breath sounds clear to auscultation       Cardiovascular hypertension, Pt. on medications + Peripheral Vascular Disease   Rhythm:Regular Rate:Bradycardia  EKG: Sinus brady with 1st degree AV Block   Neuro/Psych  Headaches, PSYCHIATRIC DISORDERS Anxiety Depression TIA   GI/Hepatic negative GI ROS, Neg liver ROS,   Endo/Other  Obesity   Renal/GU negative Renal ROS     Musculoskeletal negative musculoskeletal ROS (+)   Abdominal   Peds  Hematology negative hematology ROS (+)   Anesthesia Other Findings Day of surgery medications reviewed with the patient.  Reproductive/Obstetrics                            Anesthesia Physical Anesthesia Plan  ASA: III  Anesthesia Plan: General   Post-op Pain Management:    Induction: Intravenous  Airway Management Planned: Oral ETT  Additional Equipment:   Intra-op Plan:   Post-operative Plan: Extubation in OR  Informed Consent: I have reviewed the patients History and Physical, chart, labs and discussed the procedure including the risks, benefits and alternatives for the proposed anesthesia with the patient or authorized representative who has indicated his/her understanding and acceptance.   Dental advisory given  Plan Discussed with: CRNA  Anesthesia Plan Comments: (Risks/benefits of general anesthesia discussed with patient including risk of damage to teeth, lips, gum, and tongue, nausea/vomiting, allergic reactions to medications, and the possibility of heart attack, stroke and death.   All patient questions answered.  Patient wishes to proceed.)        Anesthesia Quick Evaluation

## 2016-11-01 NOTE — Op Note (Signed)
NAME:  [name], Rachael Gilmore                   ACCOUNT NO.:  MEDICAL RECORD NO.:  [id number]  LOCATION:                                 FACILITY:  PHYSICIAN:  Sanora Cunanan L. Shon Houghruesdale, M.D.   DATE OF BIRTH:  DATE OF PROCEDURE:  11/01/2016 DATE OF DISCHARGE:                              OPERATIVE REPORT   INDICATIONS:  This is a 51 year old lady with severe, severe macromastia, back and shoulder pain secondary to large pendulous breasts and increased intertriginous changes as well as accessory breast tissue, right and left side.  The right breast is larger than the left.  She wears an E2G bra.  PROCEDURES DONE:  Bilateral breast reductions using the inferior pedicle technique.  ANESTHESIA:  General.  DESCRIPTION OF PROCEDURE:  Preoperatively, the patient was drawn for the areas, remarked the nipple-areolar complex from over 37 cm from the suprasternal notch to up to 22.  She then underwent general anesthesia, intubated orally.  Prep was done to the chest, breast, abdominal, and lateral chest regions using Hibiclens soap and solution, walled off with sterile towels and drapes so as make a sterile field.  Tumescent solution was injected into both sides, 500 mL per side.  This allowed to wait up and the wounds were scored with #15 blade.  The skin of the inferior pedicle was de-epithelialized, #20 blades, medial and lateral fatty dermal pedicles were incised down the underlying fascia.  The new keyhole area was also debulked down to fascia.  Hemostasis was maintained with the Bovie on coagulation.  Next, out laterally, excess tissue was removed and then liposuction was fashioned in the right and left axillary regions.  Along the axillary regions grossly serratus anterior areas removing increased amounts of accessory breast tissue. After proper hemostasis, the flaps were then stayed and then rotated with 3-0 Prolene suture.  Subcutaneous closure was done with 3-0 Monocryl x2 layers, then a  running subcuticular stitch of 3-0 Monocryl throughout the inverted T.  The wounds were cleansed.  The wounds were drained with #10 fully fluted Blake drains which were placed throughout the incision deep and brought out through the lateral most portion of the incision, secured with 3-0 Prolene.  The nipple-areolar complexes were examined showing excellent blood supply.  The wounds were cleansed. The patient does have a history of hypertrophic scarring involving her left neck area, where she had some surgery done by Neurosurgery.  We injected that area today with triamcinolone 10 mg/mL, 0.5 mL along that area.  We also did along the edges of the incision, and then, we put silicone gel patches on top of that and secured that with paper tape, 4x4s, ABDs, Hypafix tape, and wraps with Kerlix.  The patient tolerated the procedures very well.  ESTIMATED BLOOD LOSS:  Less than 50 mL.  COMPLICATIONS:  None.     Yaakov GuthrieGerald L. Shon Houghruesdale, M.D.     Cathie HoopsGLT/MEDQ  D:  11/01/2016  T:  11/01/2016  Job:  [id number]

## 2016-11-01 NOTE — Anesthesia Postprocedure Evaluation (Signed)
Anesthesia Post Note  Patient: Rachael Gilmore  Procedure(s) Performed: Procedure(s) (LRB): BILATERAL BREAST REDUCTION WITH LIPOSUCTION (Bilateral) KENALOG INJECTION TO BILATERAL BREAST INCISIONS AND LEFT NECK SCAR (Left)  Patient location during evaluation: PACU Anesthesia Type: General Level of consciousness: awake and alert Pain management: pain level controlled Vital Signs Assessment: post-procedure vital signs reviewed and stable Respiratory status: spontaneous breathing, nonlabored ventilation, respiratory function stable and patient connected to nasal cannula oxygen Cardiovascular status: blood pressure returned to baseline and stable Postop Assessment: no signs of nausea or vomiting Anesthetic complications: no       Last Vitals:  Vitals:   11/01/16 1135 11/01/16 1145  BP:  127/74  Pulse: 71 73  Resp: 16 18  Temp:      Last Pain:  Vitals:   11/01/16 1145  TempSrc:   PainSc: 5                  Cecile HearingStephen Edward Aigner Horseman

## 2016-11-01 NOTE — Anesthesia Procedure Notes (Signed)
Procedure Name: Intubation Date/Time: 11/01/2016 7:53 AM Performed by: Maryella Shivers Pre-anesthesia Checklist: Patient identified, Emergency Drugs available, Suction available and Patient being monitored Patient Re-evaluated:Patient Re-evaluated prior to inductionOxygen Delivery Method: Circle system utilized Preoxygenation: Pre-oxygenation with 100% oxygen Intubation Type: IV induction Ventilation: Mask ventilation without difficulty Laryngoscope Size: Mac and 3 Tube type: Oral Tube size: 7.0 mm Number of attempts: 1 Airway Equipment and Method: Stylet and Oral airway Placement Confirmation: ETT inserted through vocal cords under direct vision,  positive ETCO2 and breath sounds checked- equal and bilateral Secured at: 21 cm Tube secured with: Tape Dental Injury: Teeth and Oropharynx as per pre-operative assessment

## 2016-11-01 NOTE — Transfer of Care (Signed)
Immediate Anesthesia Transfer of Care Note  Patient: Rachael Gilmore  Procedure(s) Performed: Procedure(s): BILATERAL BREAST REDUCTION WITH LIPOSUCTION (Bilateral) KENALOG INJECTION TO BILATERAL BREAST INCISIONS AND LEFT NECK SCAR (Left)  Patient Location: PACU  Anesthesia Type:General  Level of Consciousness: sedated  Airway & Oxygen Therapy: Patient Spontanous Breathing and Patient connected to face mask oxygen  Post-op Assessment: Report given to RN and Post -op Vital signs reviewed and stable  Post vital signs: Reviewed and stable  Last Vitals:  Vitals:   11/01/16 1056 11/01/16 1057  BP: (!) 141/81   Pulse: 70 71  Resp:  20  Temp:  (P) 36.7 C    Last Pain:  Vitals:   11/01/16 0629  TempSrc: Oral      Patients Stated Pain Goal: 0 (11/01/16 0629)  Complications: No apparent anesthesia complications

## 2016-11-02 ENCOUNTER — Encounter (HOSPITAL_BASED_OUTPATIENT_CLINIC_OR_DEPARTMENT_OTHER): Payer: Self-pay | Admitting: Specialist

## 2016-11-02 ENCOUNTER — Encounter: Payer: BLUE CROSS/BLUE SHIELD | Admitting: Family Medicine

## 2017-01-17 ENCOUNTER — Other Ambulatory Visit: Payer: Self-pay | Admitting: Family Medicine

## 2017-01-17 DIAGNOSIS — F419 Anxiety disorder, unspecified: Secondary | ICD-10-CM

## 2017-01-18 ENCOUNTER — Other Ambulatory Visit: Payer: Self-pay | Admitting: Family Medicine

## 2017-01-18 DIAGNOSIS — F419 Anxiety disorder, unspecified: Secondary | ICD-10-CM

## 2017-01-18 NOTE — Telephone Encounter (Signed)
Rx called in to pharmacy. 

## 2017-01-18 NOTE — Telephone Encounter (Signed)
Please call in alprazolam.  

## 2017-02-17 DIAGNOSIS — L570 Actinic keratosis: Secondary | ICD-10-CM | POA: Diagnosis not present

## 2017-03-31 DIAGNOSIS — L570 Actinic keratosis: Secondary | ICD-10-CM | POA: Diagnosis not present

## 2017-04-08 DIAGNOSIS — Z8673 Personal history of transient ischemic attack (TIA), and cerebral infarction without residual deficits: Secondary | ICD-10-CM | POA: Diagnosis not present

## 2017-04-08 DIAGNOSIS — E785 Hyperlipidemia, unspecified: Secondary | ICD-10-CM | POA: Diagnosis not present

## 2017-04-08 DIAGNOSIS — I6523 Occlusion and stenosis of bilateral carotid arteries: Secondary | ICD-10-CM | POA: Diagnosis not present

## 2017-04-08 DIAGNOSIS — I10 Essential (primary) hypertension: Secondary | ICD-10-CM | POA: Diagnosis not present

## 2017-04-20 ENCOUNTER — Encounter: Payer: Self-pay | Admitting: Family Medicine

## 2017-04-22 DIAGNOSIS — Z8673 Personal history of transient ischemic attack (TIA), and cerebral infarction without residual deficits: Secondary | ICD-10-CM | POA: Insufficient documentation

## 2017-05-12 DIAGNOSIS — L570 Actinic keratosis: Secondary | ICD-10-CM | POA: Diagnosis not present

## 2017-07-09 HISTORY — PX: BREAST SURGERY: SHX581

## 2017-07-26 ENCOUNTER — Other Ambulatory Visit: Payer: Self-pay

## 2017-07-26 DIAGNOSIS — L988 Other specified disorders of the skin and subcutaneous tissue: Secondary | ICD-10-CM | POA: Diagnosis not present

## 2017-07-26 DIAGNOSIS — N6001 Solitary cyst of right breast: Secondary | ICD-10-CM | POA: Diagnosis not present

## 2017-07-26 DIAGNOSIS — N6002 Solitary cyst of left breast: Secondary | ICD-10-CM | POA: Diagnosis not present

## 2017-07-26 DIAGNOSIS — D485 Neoplasm of uncertain behavior of skin: Secondary | ICD-10-CM | POA: Diagnosis not present

## 2017-07-26 HISTORY — PX: NECK SURGERY: SHX720

## 2017-07-28 DIAGNOSIS — L570 Actinic keratosis: Secondary | ICD-10-CM | POA: Diagnosis not present

## 2017-08-16 ENCOUNTER — Encounter: Payer: Self-pay | Admitting: Family Medicine

## 2017-08-16 ENCOUNTER — Ambulatory Visit (INDEPENDENT_AMBULATORY_CARE_PROVIDER_SITE_OTHER): Payer: BLUE CROSS/BLUE SHIELD | Admitting: Family Medicine

## 2017-08-16 VITALS — BP 112/78 | HR 70 | Temp 98.3°F | Resp 16 | Ht 62.0 in | Wt 194.0 lb

## 2017-08-16 DIAGNOSIS — E559 Vitamin D deficiency, unspecified: Secondary | ICD-10-CM

## 2017-08-16 DIAGNOSIS — Z0001 Encounter for general adult medical examination with abnormal findings: Secondary | ICD-10-CM | POA: Diagnosis not present

## 2017-08-16 DIAGNOSIS — I779 Disorder of arteries and arterioles, unspecified: Secondary | ICD-10-CM

## 2017-08-16 DIAGNOSIS — I10 Essential (primary) hypertension: Secondary | ICD-10-CM

## 2017-08-16 DIAGNOSIS — L304 Erythema intertrigo: Secondary | ICD-10-CM

## 2017-08-16 DIAGNOSIS — E785 Hyperlipidemia, unspecified: Secondary | ICD-10-CM | POA: Diagnosis not present

## 2017-08-16 DIAGNOSIS — Z1211 Encounter for screening for malignant neoplasm of colon: Secondary | ICD-10-CM | POA: Diagnosis not present

## 2017-08-16 DIAGNOSIS — F419 Anxiety disorder, unspecified: Secondary | ICD-10-CM | POA: Diagnosis not present

## 2017-08-16 DIAGNOSIS — Z1239 Encounter for other screening for malignant neoplasm of breast: Secondary | ICD-10-CM

## 2017-08-16 DIAGNOSIS — I739 Peripheral vascular disease, unspecified: Secondary | ICD-10-CM

## 2017-08-16 DIAGNOSIS — Z6835 Body mass index (BMI) 35.0-35.9, adult: Secondary | ICD-10-CM

## 2017-08-16 DIAGNOSIS — Z23 Encounter for immunization: Secondary | ICD-10-CM

## 2017-08-16 DIAGNOSIS — Z1231 Encounter for screening mammogram for malignant neoplasm of breast: Secondary | ICD-10-CM

## 2017-08-16 DIAGNOSIS — Z1159 Encounter for screening for other viral diseases: Secondary | ICD-10-CM | POA: Diagnosis not present

## 2017-08-16 DIAGNOSIS — Z0000 Encounter for general adult medical examination without abnormal findings: Secondary | ICD-10-CM

## 2017-08-16 DIAGNOSIS — B351 Tinea unguium: Secondary | ICD-10-CM

## 2017-08-16 MED ORDER — PRAVASTATIN SODIUM 20 MG PO TABS: 20.0000 mg | ORAL_TABLET | Freq: Every day | ORAL | 4 refills | Status: DC

## 2017-08-16 MED ORDER — TERBINAFINE HCL 250 MG PO TABS: 250.0000 mg | ORAL_TABLET | Freq: Every day | ORAL | 1 refills | Status: DC

## 2017-08-16 MED ORDER — TRIAMTERENE-HCTZ 37.5-25 MG PO TABS: 1.0000 | ORAL_TABLET | Freq: Every day | ORAL | 4 refills | Status: DC

## 2017-08-16 MED ORDER — ALPRAZOLAM 0.5 MG PO TABS: ORAL_TABLET | ORAL | 3 refills | Status: DC

## 2017-08-16 MED ORDER — NYSTATIN 100000 UNIT/GM EX OINT: TOPICAL_OINTMENT | CUTANEOUS | 3 refills | Status: AC

## 2017-08-16 NOTE — Patient Instructions (Signed)
Please call the [facility] [phone]) to schedule a routine screening mammogram.   Preventive Care 40-64 Years, Female Preventive care refers to lifestyle choices and visits with your health care provider that can promote health and wellness. What does preventive care include?  A yearly physical exam. This is also called an annual well check.  Dental exams once or twice a year.  Routine eye exams. Ask your health care provider how often you should have your eyes checked.  Personal lifestyle choices, including: ? Daily care of your teeth and gums. ? Regular physical activity. ? Eating a healthy diet. ? Avoiding tobacco and drug use. ? Limiting alcohol use. ? Practicing safe sex. ? Taking low-dose aspirin daily starting at age 46. ? Taking vitamin and mineral supplements as recommended by your health care provider. What happens during an annual well check? The services and screenings done by your health care provider during your annual well check will depend on your age, overall health, lifestyle risk factors, and family history of disease. Counseling Your health care provider may ask you questions about your:  Alcohol use.  Tobacco use.  Drug use.  Emotional well-being.  Home and relationship well-being.  Sexual activity.  Eating habits.  Work and work Statistician.  Method of birth control.  Menstrual cycle.  Pregnancy history.  Screening You may have the following tests or measurements:  Height, weight, and BMI.  Blood pressure.  Lipid and cholesterol levels. These may be checked every 5 years, or more frequently if you are over 38 years old.  Skin check.  Lung cancer screening. You may have this screening every year starting at age 83 if you have a 30-pack-year history of smoking and currently smoke or have quit within the past 15 years.  Fecal occult blood test (FOBT) of the stool. You may have this test every year starting at age  29.  Flexible sigmoidoscopy or colonoscopy. You may have a sigmoidoscopy every 5 years or a colonoscopy every 10 years starting at age 75.  Hepatitis C blood test.  Hepatitis B blood test.  Sexually transmitted disease (STD) testing.  Diabetes screening. This is done by checking your blood sugar (glucose) after you have not eaten for a while (fasting). You may have this done every 1-3 years.  Mammogram. This may be done every 1-2 years. Talk to your health care provider about when you should start having regular mammograms. This may depend on whether you have a family history of breast cancer.  BRCA-related cancer screening. This may be done if you have a family history of breast, ovarian, tubal, or peritoneal cancers.  Pelvic exam and Pap test. This may be done every 3 years starting at age 2. Starting at age 55, this may be done every 5 years if you have a Pap test in combination with an HPV test.  Bone density scan. This is done to screen for osteoporosis. You may have this scan if you are at high risk for osteoporosis.  Discuss your test results, treatment options, and if necessary, the need for more tests with your health care provider. Vaccines Your health care provider may recommend certain vaccines, such as:  Influenza vaccine. This is recommended every year.  Tetanus, diphtheria, and acellular pertussis (Tdap, Td) vaccine. You may need a Td booster every 10 years.  Varicella vaccine. You may need this if you have not been vaccinated.  Zoster vaccine. You may need this after age 54.  Measles, mumps, and  rubella (MMR) vaccine. You may need at least one dose of MMR if you were born in 1957 or later. You may also need a second dose.  Pneumococcal 13-valent conjugate (PCV13) vaccine. You may need this if you have certain conditions and were not previously vaccinated.  Pneumococcal polysaccharide (PPSV23) vaccine. You may need one or two doses if you smoke cigarettes or if you  have certain conditions.  Meningococcal vaccine. You may need this if you have certain conditions.  Hepatitis A vaccine. You may need this if you have certain conditions or if you travel or work in places where you may be exposed to hepatitis A.  Hepatitis B vaccine. You may need this if you have certain conditions or if you travel or work in places where you may be exposed to hepatitis B.  Haemophilus influenzae type b (Hib) vaccine. You may need this if you have certain conditions.  Talk to your health care provider about which screenings and vaccines you need and how often you need them. This information is not intended to replace advice given to you by your health care provider. Make sure you discuss any questions you have with your health care provider. Document Released: 08/22/2015 Document Revised: 04/14/2016 Document Reviewed: 05/27/2015 Elsevier Interactive Patient Education  [employer].

## 2017-08-16 NOTE — Progress Notes (Signed)
Patient: Rachael Gilmore, Female    DOB: 1965-12-12, 52 y.o.   MRN: [id number] Visit Date: 08/16/2017  Today's Provider: Mila Merry, MD   No chief complaint on file.  Subjective:    Annual physical exam Rachael Gilmore is a 52 y.o. female who presents today for health maintenance and complete physical. She feels well. She reports exercising 5 times a day. She reports she is sleeping fairly well.Since her last visit she has had breast reduction surgery and revision of scar from [facility]. She is still healing from these surgery and not yet ready for mammogram or breast exam.   -----------------------------------------------------------------   Hypertension, follow-up:  BP Readings from Last 3 Encounters:  11/01/16 127/74  07/02/16 114/84  10/30/15 112/70    She was last seen for hypertension 07/02/2016.  BP at that visit was 114/84. Management since that visit includes; no changes.She reports good compliance with treatment. She is not having side effects.  She is exercising. She is adherent to low salt diet.   Outside blood pressures are not checked. She is experiencing none.  Patient denies chest pain, chest pressure/discomfort, claudication, dyspnea, exertional chest pressure/discomfort, fatigue, irregular heart beat, lower extremity edema, near-syncope, orthopnea, palpitations, paroxysmal nocturnal dyspnea, syncope and tachypnea.   Cardiovascular risk factors include dyslipidemia and hypertension.  Use of agents associated with hypertension: NSAIDS.   ------------------------------------------------------------------------    Lipid/Cholesterol, Follow-up:   Last seen for this 07/02/2016.  Management since that visit includes; labs checked, added Zetia to pravastatin. She states she was tolerating both medications well without adverse effects. She states that her cardiologist told her to stop taking Zetia at recent follow up . She also had carotid ultrasound done  which she reports was stable.    Last Lipid Panel:    Component Value Date/Time   CHOL 164 09/24/2016 0933   TRIG 111 09/24/2016 0933   HDL 41 09/24/2016 0933   CHOLHDL 4.0 09/24/2016 0933   LDLCALC 101 (H) 09/24/2016 0933    She reports good compliance with treatment. She is not having side effects.   Wt Readings from Last 3 Encounters:  11/01/16 194 lb 9.6 oz (88.3 kg)  07/02/16 210 lb (95.3 kg)  10/30/15 229 lb (103.9 kg)    ------------------------------------------------------------------------ Vitamin D deficiency From 07/02/2016-labs checked, no changes. Patient reports good compliance with treatment.\ Lab Results  Component Value Date   VD25OH 47.1 09/24/2016     Left-sided carotid artery disease (HCC) From 07/02/2016-no changes. Continue aggressive risk factor modification.   Anxiety  From 07/02/2016-no changes, stable. Patient reports this condition is stable. She takes alprazolam occasionally which continues to work well.   Onychomycosis: She has history of onychomycosis and reports that she usually responds well to oral terbinafine previously prescribed by Dr. Elease Hashimoto. She requests refill today.     Review of Systems  Constitutional: Negative for chills, fatigue and fever.  HENT: Negative for congestion, ear pain, rhinorrhea, sneezing and sore throat.   Eyes: Negative.  Negative for pain and redness.  Respiratory: Negative for cough, shortness of breath and wheezing.   Cardiovascular: Negative for chest pain and leg swelling.  Gastrointestinal: Negative for abdominal pain, blood in stool, constipation, diarrhea and nausea.  Endocrine: Negative for polydipsia and polyphagia.  Genitourinary: Negative.  Negative for dysuria, flank pain, hematuria, pelvic pain, vaginal bleeding and vaginal discharge.  Musculoskeletal: Negative for arthralgias, back pain, gait problem and joint swelling.  Skin: Negative for rash.  Neurological: Negative.  Negative  for  dizziness, tremors, seizures, weakness, light-headedness, numbness and headaches.  Hematological: Negative for adenopathy.  Psychiatric/Behavioral: Negative for behavioral problems, confusion and dysphoric mood. The patient is nervous/anxious. The patient is not hyperactive.     Social History      She  reports that she quit smoking about 8 years ago. she has never used smokeless tobacco. She reports that she drinks alcohol. She reports that she does not use drugs.       Social History   Socioeconomic History  . Marital status: Legally Separated    Spouse name: Not on file  . Number of children: 2  . Years of education: 415  . Highest education level: Not on file  Social Needs  . Financial resource strain: Not on file  . Food insecurity - worry: Not on file  . Food insecurity - inability: Not on file  . Transportation needs - medical: Not on file  . Transportation needs - non-medical: Not on file  Occupational History    Comment: 2 part time jobs, at [employer], and [employer]  Tobacco Use  . Smoking status: Former Smoker    Last attempt to quit: 03/08/2009    Years since quitting: 8.4  . Smokeless tobacco: Never Used  Substance and Sexual Activity  . Alcohol use: Yes    Comment: socially  . Drug use: No  . Sexual activity: Not on file  Other Topics Concern  . Not on file  Social History Narrative  . Not on file    Past Medical History:  Diagnosis Date  . Anxiety   . Depression   . Hyperlipidemia   . Hypertension   . Macromastia   . Sleep apnea    not used CPAP in 6 yrs  . TIA (transient ischemic attack) 01/14/2016   amaurosis fugax. [facility]. MRI brain unremarkable     Patient Active Problem List   Diagnosis Date Noted  . Large breasts 07/21/2015  . Seasonal affective disorder (HCC) 07/21/2015  . Tinea corporis 07/21/2015  . Hyperlipemia 07/21/2015  . Major depression in remission (HCC) 04/17/2015  . AF (amaurosis fugax) 04/16/2015  . Irregular bleeding  04/16/2015  . Headache, migraine 04/16/2015  . Obesity 04/16/2015  . Fungal infection of nail 04/16/2015  . Vitamin D deficiency 04/16/2015  . Anxiety 02/06/2015  . Carotid artery disease (HCC) 08/11/2010  . HPTH (hyperparathyroidism) (HCC) 06/11/2010  . OSA on CPAP 01/24/2010  . Hairiness 11/17/2009  . Essential hypertension 03/07/2009  . Palpitations 11/11/2008    Past Surgical History:  Procedure Laterality Date  . BREAST REDUCTION SURGERY Bilateral 11/01/2016   Procedure: BILATERAL BREAST REDUCTION WITH LIPOSUCTION;  Surgeon: Louisa SecondGerald Truesdale, MD;  Location: [facility];  Service: Plastics;  Laterality: Bilateral;  . CAROTID ENDARTERECTOMY Left 02/07/2009   [facility]  . CESAREAN SECTION  1992 and 1994  . KENALOG INJECTION Left 11/01/2016   Procedure: KENALOG INJECTION TO BILATERAL BREAST INCISIONS AND LEFT NECK SCAR;  Surgeon: Louisa SecondGerald Truesdale, MD;  Location: [facility];  Service: Plastics;  Laterality: Left;  . TONSILLECTOMY  1969    Family History        Family Status  Relation Name Status  . Son  Alive  . Son  Alive  . Mother  Alive  . Father  Deceased at age 54       MVA        Her family history includes Albinism in her son; Arrhythmia in her mother; Autism in her  son.     Allergies  Allergen Reactions  . Atorvastatin     Severe muscle aches and weakness.      Current Outpatient Medications:  .  ALPRAZolam (XANAX) 0.5 MG tablet, TAKE ONE-HALF TO ONE TABLET BY MOUTH TWICE DAILY AS NEEDED, Disp: 60 tablet, Rfl: 3 .  aspirin 325 MG tablet, Take 1 tablet by mouth daily., Disp: , Rfl:  .  Coenzyme Q10 (CO Q 10) 100 MG CAPS, Take 1 capsule by mouth daily., Disp: , Rfl:  .  Multiple Vitamin (MULTI-VITAMINS) TABS, Take 1 tablet by mouth daily., Disp: , Rfl:  .  pravastatin (PRAVACHOL) 20 MG tablet, Take 20 mg by mouth daily., Disp: , Rfl:  .  triamterene-hydrochlorothiazide (MAXZIDE-25) 37.5-25 MG tablet, TAKE ONE TABLET BY MOUTH ONCE DAILY,  Disp: 90 tablet, Rfl: 3 .  ezetimibe (ZETIA) 10 MG tablet, Take 1 tablet (10 mg total) by mouth daily. (Patient not taking: Reported on 08/16/2017), Disp: 30 tablet, Rfl: 12   Patient Care Team: Malva Limes, MD as PCP - General (Family Medicine) Sherrilyn Rist. (Inactive) (Cardiology) Jay Schlichter, MD as Referring Physician (Neurology)      Objective:   Vitals: BP 112/78 (BP Location: Left Arm, Patient Position: Sitting, Cuff Size: Large)   Pulse 70   Temp 98.3 F (36.8 C) (Oral)   Resp 16   Ht 5\' 2"  (1.575 m)   Wt 194 lb (88 kg)   SpO2 98% Comment: room air  BMI 35.48 kg/m   There were no vitals filed for this visit.   Physical Exam   General Appearance:    Alert, cooperative, no distress, appears stated age, obese  Head:    Normocephalic, without obvious abnormality, atraumatic  Eyes:    PERRL, conjunctiva/corneas clear, EOM's intact, fundi    benign, both eyes  Ears:    Normal TM's and external ear canals, both ears  Nose:   Nares normal, septum midline, mucosa normal, no drainage    or sinus tenderness  Throat:   Lips, mucosa, and tongue normal; teeth and gums normal  Neck:   Supple, symmetrical, trachea midline, no adenopathy;    thyroid:  no enlargement/tenderness/nodules; no carotid   bruit or JVD  Back:     Symmetric, no curvature, ROM normal, no CVA tenderness  Lungs:     Clear to auscultation bilaterally, respirations unlabored  Chest Wall:    No tenderness or deformity   Heart:    Regular rate and rhythm, S1 and S2 normal, no murmur, rub   or gallop  Breast Exam:    deferred (recent breast reduction surgery)  Abdomen:     Soft, non-tender, bowel sounds active all four quadrants,    no masses, no organomegaly  Pelvic:    deferred (pap due 2022)  Extremities:   Extremities normal, atraumatic, no cyanosis or edema. Onychomycosis of toes of both feet.   Pulses:   2+ and symmetric all extremities  Skin:   Skin color, texture, turgor normal, no rashes or  lesions  Lymph nodes:   Cervical, supraclavicular, and axillary nodes normal  Neurologic:   CNII-XII intact, normal strength, sensation and reflexes    throughout    Depression Screen PHQ 2/9 Scores 08/16/2017 10/30/2015 04/17/2015  PHQ - 2 Score 0 0 0  PHQ- 9 Score 6 - 1      Assessment & Plan:     Routine Health Maintenance and Physical Exam  Exercise Activities and Dietary recommendations Goals  None      Immunization History  Administered Date(s) Administered  . Tdap 07/11/2014    Health Maintenance  Topic Date Due  . Hepatitis C Screening  04-Feb-1966  . HIV Screening  10/24/1978  . COLONOSCOPY  10/23/2013  . MAMMOGRAM  08/23/2014  . INFLUENZA VACCINE  03/09/2017  . PAP SMEAR  10/30/2018  . TETANUS/TDAP  07/11/2024     Discussed health benefits of physical activity, and encouraged her to engage in regular exercise appropriate for her age and condition.    --------------------------------------------------------------------  1. Annual physical exam  - Lipid panel - Comprehensive metabolic panel  2. Breast cancer screening She is going to call [facility] to schedule mammogram when she is healed from breast reduction surgery.   3. Screening for colon cancer  - Ambulatory referral to Gastroenterology  4. Need for hepatitis C screening test  - Hepatitis C antibody  5. Essential hypertension Well controlled.  Continue current medications.    6. Left-sided carotid artery disease, unspecified type (HCC) Asymptomatic. Compliant with medication.  Continue aggressive risk factor modification.    7. Vitamin D deficiency Currently taking MVI with vitamin D, but no additional D supplments.  - VITAMIN D 25 Hydroxy (Vit-D Deficiency, Fractures)  8. Anxiety Doing well with occasionally  ALPRAZolam (XANAX) 0.5 MG tablet; TAKE ONE-HALF TO ONE TABLET BY MOUTH TWICE DAILY AS NEEDED  Dispense: 60 tablet; Refill: 3  9. Onychomycosis  - terbinafine (LAMISIL) 250 MG  tablet; Take 1 tablet (250 mg total) by mouth daily.  Dispense: 90 tablet; Refill: 1  10. BMI 35.0-35.9,adult Counseled regarding prudent diet and regular exercise.   - TSH  11. Hyperlipidemia, unspecified hyperlipidemia type Has been out of pravastatin for the last month and off ezetimibe since her last cardiology appt. She is going to restart pravastatin and check lipids in about 1 months.  - Lipid panel - Comprehensive metabolic panel  12. Intertrigo Refill nystatin ointment  13. Need for shingles vaccine  - Varicella-zoster vaccine IM  Return Shingrix #2 in 2 months.    Mila Merry, MD  [facility]

## 2017-09-22 ENCOUNTER — Other Ambulatory Visit: Payer: Self-pay

## 2017-10-10 ENCOUNTER — Telehealth: Payer: Self-pay

## 2017-10-10 NOTE — Telephone Encounter (Signed)
Patient advised and put on waiting list.

## 2017-10-10 NOTE — Telephone Encounter (Signed)
Patient is scheduled to have her 2nd Shingrix vaccine on 09/19/2017. I tried calling patient to inform her that we are completely out of this vaccine due to national shortage. Left message for her to call back. We need to cancel patients appointment on 09/19/2017 and place patient's name on the call back waiting list located in room 4.  If patient calls back please advise her of this and place her name on the waiting list. Thanks.

## 2017-10-17 ENCOUNTER — Ambulatory Visit: Payer: Self-pay | Admitting: Family Medicine

## 2017-11-02 ENCOUNTER — Ambulatory Visit (INDEPENDENT_AMBULATORY_CARE_PROVIDER_SITE_OTHER): Payer: BLUE CROSS/BLUE SHIELD

## 2017-11-02 DIAGNOSIS — Z23 Encounter for immunization: Secondary | ICD-10-CM | POA: Diagnosis not present

## 2017-11-05 ENCOUNTER — Encounter: Payer: Self-pay | Admitting: Emergency Medicine

## 2017-11-05 ENCOUNTER — Emergency Department
Admission: EM | Admit: 2017-11-05 | Discharge: 2017-11-06 | Disposition: A | Discharge status: Home / Self Care | Payer: BLUE CROSS/BLUE SHIELD | Attending: Emergency Medicine | Admitting: Emergency Medicine

## 2017-11-05 ENCOUNTER — Other Ambulatory Visit: Payer: Self-pay

## 2017-11-05 DIAGNOSIS — R112 Nausea with vomiting, unspecified: Secondary | ICD-10-CM | POA: Insufficient documentation

## 2017-11-05 DIAGNOSIS — I10 Essential (primary) hypertension: Secondary | ICD-10-CM | POA: Diagnosis not present

## 2017-11-05 DIAGNOSIS — Z79899 Other long term (current) drug therapy: Secondary | ICD-10-CM | POA: Diagnosis not present

## 2017-11-05 DIAGNOSIS — Z8673 Personal history of transient ischemic attack (TIA), and cerebral infarction without residual deficits: Secondary | ICD-10-CM | POA: Diagnosis not present

## 2017-11-05 DIAGNOSIS — I2510 Atherosclerotic heart disease of native coronary artery without angina pectoris: Secondary | ICD-10-CM | POA: Insufficient documentation

## 2017-11-05 DIAGNOSIS — F329 Major depressive disorder, single episode, unspecified: Secondary | ICD-10-CM | POA: Insufficient documentation

## 2017-11-05 DIAGNOSIS — Z87891 Personal history of nicotine dependence: Secondary | ICD-10-CM | POA: Insufficient documentation

## 2017-11-05 DIAGNOSIS — R55 Syncope and collapse: Secondary | ICD-10-CM | POA: Diagnosis not present

## 2017-11-05 DIAGNOSIS — Z7982 Long term (current) use of aspirin: Secondary | ICD-10-CM | POA: Diagnosis not present

## 2017-11-05 DIAGNOSIS — R197 Diarrhea, unspecified: Secondary | ICD-10-CM | POA: Diagnosis not present

## 2017-11-05 DIAGNOSIS — F419 Anxiety disorder, unspecified: Secondary | ICD-10-CM | POA: Diagnosis not present

## 2017-11-05 DIAGNOSIS — R1110 Vomiting, unspecified: Secondary | ICD-10-CM | POA: Diagnosis not present

## 2017-11-05 HISTORY — DX: Cerebral infarction, unspecified: I63.9

## 2017-11-05 LAB — COMPREHENSIVE METABOLIC PANEL
ALBUMIN: 4.8 g/dL (ref 3.5–5.0)
ALT: 14 U/L (ref 14–54)
AST: 22 U/L (ref 15–41)
Alkaline Phosphatase: 75 U/L (ref 38–126)
Anion gap: 11 (ref 5–15)
BUN: 15 mg/dL (ref 6–20)
CHLORIDE: 102 mmol/L (ref 101–111)
CO2: 23 mmol/L (ref 22–32)
CREATININE: 0.69 mg/dL (ref 0.44–1.00)
Calcium: 9.5 mg/dL (ref 8.9–10.3)
GFR calc Af Amer: >60 mL/min (ref >60)
GFR calc non Af Amer: >60 mL/min (ref >60)
Glucose, Bld: 125 mg/dL — ABNORMAL HIGH (ref 65–99)
Potassium: 3.4 mmol/L — ABNORMAL LOW (ref 3.5–5.1)
SODIUM: 136 mmol/L (ref 135–145)
Total Bilirubin: 0.9 mg/dL (ref 0.3–1.2)
Total Protein: 8.1 g/dL (ref 6.5–8.1)

## 2017-11-05 LAB — URINALYSIS, COMPLETE (UACMP) WITH MICROSCOPIC
BACTERIA UA: NONE SEEN
Bilirubin Urine: NEGATIVE
Glucose, UA: NEGATIVE mg/dL
KETONES UR: NEGATIVE mg/dL
Leukocytes, UA: NEGATIVE
Nitrite: NEGATIVE
PH: 5 (ref 5.0–8.0)
Protein, ur: NEGATIVE mg/dL
SPECIFIC GRAVITY, URINE: 1.019 (ref 1.005–1.030)
SQUAMOUS EPITHELIAL / LPF: NONE SEEN

## 2017-11-05 LAB — CBC
HEMATOCRIT: 48 % — AB (ref 35.0–47.0)
Hemoglobin: 16.3 g/dL — ABNORMAL HIGH (ref 12.0–16.0)
MCH: 29.6 pg (ref 26.0–34.0)
MCHC: 33.9 g/dL (ref 32.0–36.0)
MCV: 87.3 fL (ref 80.0–100.0)
Platelets: 200 10*3/uL (ref 150–440)
RBC: 5.5 MIL/uL — ABNORMAL HIGH (ref 3.80–5.20)
RDW: 12.9 % (ref 11.5–14.5)
WBC: 7.3 10*3/uL (ref 3.6–11.0)

## 2017-11-05 LAB — LIPASE, BLOOD: Lipase: 37 U/L (ref 11–51)

## 2017-11-05 MED ORDER — LOPERAMIDE HCL 2 MG PO TABS: 2.0000 mg | ORAL_TABLET | Freq: Four times a day (QID) | ORAL | 0 refills | Status: DC | PRN

## 2017-11-05 MED ORDER — ONDANSETRON HCL 4 MG PO TABS: 4.0000 mg | ORAL_TABLET | Freq: Three times a day (TID) | ORAL | 0 refills | Status: AC | PRN

## 2017-11-05 MED ORDER — DICYCLOMINE HCL 10 MG PO CAPS
10.0000 mg | ORAL_CAPSULE | Freq: Once | ORAL | Status: AC
  Administered 2017-11-05: 10 mg via ORAL
  Filled 2017-11-05: qty 1

## 2017-11-05 MED ORDER — ONDANSETRON HCL 4 MG/2ML IJ SOLN
4.0000 mg | Freq: Once | INTRAMUSCULAR | Status: AC
  Administered 2017-11-05: 4 mg via INTRAVENOUS
  Filled 2017-11-05: qty 2

## 2017-11-05 MED ORDER — SODIUM CHLORIDE 0.9 % IV BOLUS
1000.0000 mL | Freq: Once | INTRAVENOUS | Status: AC
  Administered 2017-11-05: 1000 mL via INTRAVENOUS

## 2017-11-05 MED ORDER — ONDANSETRON HCL 4 MG/2ML IJ SOLN
INTRAMUSCULAR | Status: AC
  Administered 2017-11-05: 4 mg
  Filled 2017-11-05: qty 2

## 2017-11-05 MED ORDER — ACETAMINOPHEN 325 MG PO TABS
650.0000 mg | ORAL_TABLET | Freq: Once | ORAL | Status: AC
  Administered 2017-11-05: 650 mg via ORAL
  Filled 2017-11-05: qty 2

## 2017-11-05 MED ORDER — DICYCLOMINE HCL 10 MG PO CAPS: 10.0000 mg | ORAL_CAPSULE | Freq: Three times a day (TID) | ORAL | 0 refills | Status: DC | PRN

## 2017-11-05 NOTE — ED Provider Notes (Signed)
-----------------------------------------   11:44 PM on 11/05/2017 -----------------------------------------  Patient appears well, has received fluids, states no current nausea but did have another episode of diarrhea while in the emergency department.  Highly suspect gastroenteritis.  Reassuring labs.  We will discharge with Zofran and loperamide, instructed the patient to drink plenty fluids.  Also discussed hygiene, handwashing and use of chlorine-containing solutions for cleaning.   Minna AntisPaduchowski, Alizeh Madril, MD 11/05/17 [phone]

## 2017-11-05 NOTE — ED Notes (Signed)
Ns completed by accident - still has 300cc to go.

## 2017-11-05 NOTE — ED Triage Notes (Signed)
Pt to ED via [facility] from home for N/V/D. Symptoms has been on and off since Wednesday. Pt states that she has had episodes where she felt like she was going to pass out but she didn't. Pt states that she has had body aches and chills, denies fever. Pt has hx/o CVA. Pt states that she has had 10 episodes of vomiting since this morning and 20 episodes of diarrhea. Pt states that she got the shingles vaccine on Wednesday, is unsure if this is related to vaccine.   Pt in NAD at this time.

## 2017-11-05 NOTE — ED Provider Notes (Signed)
[facility] Emergency Department Provider Note ____________________________________________   First MD Initiated Contact with Patient 11/05/17 1712     (approximate)  I have reviewed the triage vital signs and the nursing notes.   HISTORY  Chief Complaint Emesis and Near Syncope    HPI Rachael Gilmore is a 52 y.o. female with past medical history as noted below who presents with vomiting for the last 1 day, about 10 episodes, nonbloody, and associated with multiple episodes of watery diarrhea.  She reports generalized weakness, with resulting near syncope today while she was in the bathroom.  She also reports associated body aches and chills but no fever.  She denies any chest pain, difficulty breathing, URI symptoms, or urinary symptoms.  She states she did feel some weakness and malaise after getting shingles vaccine 3 days ago.  Past Medical History:  Diagnosis Date  . Anxiety   . Depression   . Hyperlipidemia   . Hypertension   . Macromastia   . Sleep apnea    not used CPAP in 6 yrs  . Stroke (HCC)   . TIA (transient ischemic attack) 01/14/2016   amaurosis fugax. [facility]. MRI brain unremarkable    Patient Active Problem List   Diagnosis Date Noted  . Large breasts 07/21/2015  . Seasonal affective disorder (HCC) 07/21/2015  . Tinea corporis 07/21/2015  . Hyperlipemia 07/21/2015  . Major depression in remission (HCC) 04/17/2015  . AF (amaurosis fugax) 04/16/2015  . Irregular bleeding 04/16/2015  . Headache, migraine 04/16/2015  . Obesity 04/16/2015  . Fungal infection of nail 04/16/2015  . Vitamin D deficiency 04/16/2015  . Anxiety 02/06/2015  . Carotid artery disease (HCC) 08/11/2010  . HPTH (hyperparathyroidism) (HCC) 06/11/2010  . OSA on CPAP 01/24/2010  . Hairiness 11/17/2009  . Essential hypertension 03/07/2009  . Palpitations 11/11/2008    Past Surgical History:  Procedure Laterality Date  . BREAST REDUCTION SURGERY Bilateral  11/01/2016   Procedure: BILATERAL BREAST REDUCTION WITH LIPOSUCTION;  Surgeon: Louisa SecondGerald Truesdale, MD;  Location: [facility];  Service: Plastics;  Laterality: Bilateral;  . BREAST SURGERY  07/2017   Revision of breast   . CAROTID ENDARTERECTOMY Left 02/07/2009   [facility]  . CESAREAN SECTION  1992 and 1994  . KENALOG INJECTION Left 11/01/2016   Procedure: KENALOG INJECTION TO BILATERAL BREAST INCISIONS AND LEFT NECK SCAR;  Surgeon: Louisa SecondGerald Truesdale, MD;  Location:  SURGERY CENTER;  Service: Plastics;  Laterality: Left;  . NECK SURGERY  07/26/2017   scar revision on left side of neck  . TONSILLECTOMY  1969    Prior to Admission medications   Medication Sig Start Date End Date Taking? Authorizing Provider  ALPRAZolam Prudy Feeler(XANAX) 0.5 MG tablet TAKE ONE-HALF TO ONE TABLET BY MOUTH TWICE DAILY AS NEEDED 08/16/17   Malva LimesFisher, Donald E, MD  aspirin 325 MG tablet Take 1 tablet by mouth daily. 03/24/09   [provider]  Coenzyme Q10 (CO Q 10) 100 MG CAPS Take 1 capsule by mouth daily.    [provider]  Multiple Vitamin (MULTI-VITAMINS) TABS Take 1 tablet by mouth daily.    [provider]  nystatin ointment (MYCOSTATIN) Apply affected area twice daily as needed 08/16/17   Malva LimesFisher, Donald E, MD  pravastatin (PRAVACHOL) 20 MG tablet Take 1 tablet (20 mg total) by mouth daily. 08/16/17   Malva LimesFisher, Donald E, MD  terbinafine (LAMISIL) 250 MG tablet Take 1 tablet (250 mg total) by mouth daily. 08/16/17   Mila MerryFisher, Donald  E, MD  triamterene-hydrochlorothiazide (MAXZIDE-25) 37.5-25 MG tablet Take 1 tablet by mouth daily. 08/16/17   Malva Limes, MD    Allergies Atorvastatin  Family History  Problem Relation Age of Onset  . Albinism Son   . Autism Son   . Arrhythmia Mother   . Breast cancer Sister     Social History Social History   Tobacco Use  . Smoking status: Former Smoker    Last attempt to quit: 03/08/2009    Years since quitting: 8.6  . Smokeless tobacco:  Never Used  Substance Use Topics  . Alcohol use: Yes    Comment: socially  . Drug use: No    Review of Systems  Constitutional: Positive for chills. Eyes: No visual changes. ENT: No sore throat. Cardiovascular: Denies chest pain. Respiratory: Denies shortness of breath. Gastrointestinal: Positive for nausea, vomiting, diarrhea.  Genitourinary: Negative for dysuria.  Musculoskeletal: Positive for body aches. Skin: Negative for rash. Neurological: Positive for mild frontal headache.   ____________________________________________   PHYSICAL EXAM:  VITAL SIGNS: ED Triage Vitals  Enc Vitals Group     BP 11/05/17 1702 118/76     Pulse Rate 11/05/17 1702 76     Resp 11/05/17 1702 16     Temp 11/05/17 1702 99.1 F (37.3 C)     Temp Source 11/05/17 1702 Oral     SpO2 11/05/17 1702 97 %     Weight 11/05/17 1703 197 lb (89.4 kg)     Height 11/05/17 1703 5\' 2"  (1.575 m)     Head Circumference --      Peak Flow --      Pain Score 11/05/17 1703 7     Pain Loc --      Pain Edu? --      Excl. in GC? --     Constitutional: Alert and oriented. Well appearing and in no acute distress. Eyes: Conjunctivae are normal.  Head: Atraumatic. Nose: No congestion/rhinnorhea. Mouth/Throat: Mucous membranes are slightly dry.   Neck: Normal range of motion.  Cardiovascular: Normal rate, regular rhythm. Grossly normal heart sounds.  Good peripheral circulation. Respiratory: Normal respiratory effort.  No retractions. Lungs CTAB. Gastrointestinal: Soft with mild discomfort diffusely but no focal tenderness. No distention.  Genitourinary: No flank tenderness. Musculoskeletal: No lower extremity edema.  Extremities warm and well perfused.  Neurologic:  Normal speech and language. No gross focal neurologic deficits are appreciated.  Skin:  Skin is warm and dry. No rash noted. Psychiatric: Mood and affect are normal. Speech and behavior are  normal.  ____________________________________________   LABS (all labs ordered are listed, but only abnormal results are displayed)  Labs Reviewed  COMPREHENSIVE METABOLIC PANEL - Abnormal; Notable for the following components:      Result Value   Potassium 3.4 (*)    Glucose, Bld 125 (*)    All other components within normal limits  CBC - Abnormal; Notable for the following components:   RBC 5.50 (*)    Hemoglobin 16.3 (*)    HCT 48.0 (*)    All other components within normal limits  URINALYSIS, COMPLETE (UACMP) WITH MICROSCOPIC - Abnormal; Notable for the following components:   Color, Urine YELLOW (*)    APPearance CLEAR (*)    Hgb urine dipstick MODERATE (*)    All other components within normal limits  LIPASE, BLOOD   ____________________________________________  EKG  ED ECG REPORT I, Dionne Bucy, the attending physician, personally viewed and interpreted this ECG.  Date: 11/05/2017 EKG  Time: 1659 Rate: 78 Rhythm: normal sinus rhythm QRS Axis: normal Intervals: normal ST/T Wave abnormalities: normal Narrative Interpretation: no evidence of acute ischemia  ____________________________________________  RADIOLOGY    ____________________________________________   PROCEDURES  Procedure(s) performed: No  Procedures  Critical Care performed: No ____________________________________________   INITIAL IMPRESSION / ASSESSMENT AND PLAN / ED COURSE  Pertinent labs & imaging results that were available during my care of the patient were reviewed by me and considered in my medical decision making (see chart for details).  52 year old female with past medical history as noted above presents with vomiting and diarrhea today along with crampy abdominal pain, body aches, chills.  Patient is concerned that the symptoms could be related to shingles vaccine she received 3 days ago.  She reports an episode of near syncope today while she was in the  bathroom.  On exam, patient is well-appearing, vital signs are normal except for borderline temperature, and the remainder the exam is as described.  Slightly dry mucous membranes.  Nonfocal neuro exam.  No focal abdominal tenderness.  Presentation is most consistent with viral gastroenteritis versus foodborne illness.  Lower likelihood of side effects of vaccine.  We will obtain basic labs, give IV fluids and symptomatic treatment, and reassess.    ----------------------------------------- 8:24 PM on 11/05/2017 -----------------------------------------  Patient reports persistent diarrhea but improved nausea and abdominal cramping.  We will give some additional fluids.  Patient's lab workup is reassuring.  Plan for discharge home after additional fluids.  Return precautions given, and the patient expresses understanding.  ____________________________________________   FINAL CLINICAL IMPRESSION(S) / ED DIAGNOSES  Final diagnoses:  Nausea vomiting and diarrhea      NEW MEDICATIONS STARTED DURING THIS VISIT:  New Prescriptions   No medications on file     Note:  This document was prepared using Dragon voice recognition software and may include unintentional dictation errors.    Dionne Bucy, MD 11/05/17 2025

## 2017-11-05 NOTE — ED Notes (Signed)
Pt medicated with zofran for nausea as order by dr. Marisa SeverinSiadecki. Pt also placed on bedpan and had loose/diarrhea stool.

## 2017-11-05 NOTE — Discharge Instructions (Signed)
Take the nausea medication and the Bentyl for abdominal cramping as needed.  Drink small amount of fluids frequently and gradually advance her diet over the next few days as you are able to tolerate.  Return to the ER for new, worsening, or persistent vomiting, fever, weakness, feeling like you are going to pass out again, blood in the stool or in the vomit, abdominal pain especially any constant abdominal pain, or any other new or worsening symptoms that concern you.

## 2017-11-11 DIAGNOSIS — R0602 Shortness of breath: Secondary | ICD-10-CM | POA: Insufficient documentation

## 2017-11-11 DIAGNOSIS — I10 Essential (primary) hypertension: Secondary | ICD-10-CM | POA: Diagnosis not present

## 2017-11-11 DIAGNOSIS — R0789 Other chest pain: Secondary | ICD-10-CM | POA: Diagnosis not present

## 2017-11-11 DIAGNOSIS — R079 Chest pain, unspecified: Secondary | ICD-10-CM | POA: Diagnosis not present

## 2017-11-11 DIAGNOSIS — I6523 Occlusion and stenosis of bilateral carotid arteries: Secondary | ICD-10-CM | POA: Diagnosis not present

## 2017-11-11 DIAGNOSIS — R002 Palpitations: Secondary | ICD-10-CM | POA: Diagnosis not present

## 2017-11-11 DIAGNOSIS — Z8673 Personal history of transient ischemic attack (TIA), and cerebral infarction without residual deficits: Secondary | ICD-10-CM | POA: Diagnosis not present

## 2017-12-14 DIAGNOSIS — R079 Chest pain, unspecified: Secondary | ICD-10-CM | POA: Diagnosis not present

## 2017-12-14 DIAGNOSIS — R0602 Shortness of breath: Secondary | ICD-10-CM | POA: Diagnosis not present

## 2018-05-29 ENCOUNTER — Other Ambulatory Visit: Payer: Self-pay | Admitting: Family Medicine

## 2018-05-29 DIAGNOSIS — F419 Anxiety disorder, unspecified: Secondary | ICD-10-CM

## 2018-10-13 ENCOUNTER — Other Ambulatory Visit: Payer: Self-pay | Admitting: Family Medicine

## 2019-01-12 ENCOUNTER — Other Ambulatory Visit: Payer: Self-pay | Admitting: Family Medicine

## 2019-01-12 DIAGNOSIS — F419 Anxiety disorder, unspecified: Secondary | ICD-10-CM

## 2019-02-01 ENCOUNTER — Telehealth: Payer: Self-pay

## 2019-02-01 NOTE — Telephone Encounter (Signed)
Patient called stating that 2 weeks ago she was exposed to someone who tested positive for COVID. She has been quarantine for the past 2 weeks. 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 Patient is going to check with the health dept to see if she needs to do anything else. Patient is asymptomatic  Please call  .

## 2019-02-01 NOTE — Telephone Encounter (Signed)
There's not need to test if it has been 2 weeks since her exposure and she is asymptomatic. Is she was going to get it, she would have symptoms by now.

## 2019-02-02 NOTE — Telephone Encounter (Signed)
Patient advised as directed below.  Thanks,  -Alden Feagan 

## 2019-02-02 NOTE — Telephone Encounter (Signed)
LMTCB 02/02/2019   Thanks,   -Laura  

## 2019-03-13 ENCOUNTER — Encounter: Payer: Self-pay | Admitting: Family Medicine

## 2019-03-13 ENCOUNTER — Other Ambulatory Visit: Payer: Self-pay

## 2019-03-13 ENCOUNTER — Ambulatory Visit (INDEPENDENT_AMBULATORY_CARE_PROVIDER_SITE_OTHER): Payer: BLUE CROSS/BLUE SHIELD | Admitting: Family Medicine

## 2019-03-13 VITALS — BP 142/92 | Ht 63.0 in | Wt 226.0 lb

## 2019-03-13 DIAGNOSIS — E559 Vitamin D deficiency, unspecified: Secondary | ICD-10-CM

## 2019-03-13 DIAGNOSIS — I779 Disorder of arteries and arterioles, unspecified: Secondary | ICD-10-CM

## 2019-03-13 DIAGNOSIS — E785 Hyperlipidemia, unspecified: Secondary | ICD-10-CM

## 2019-03-13 DIAGNOSIS — I10 Essential (primary) hypertension: Secondary | ICD-10-CM

## 2019-03-13 DIAGNOSIS — I739 Peripheral vascular disease, unspecified: Secondary | ICD-10-CM

## 2019-03-13 MED ORDER — TRIAMTERENE-HCTZ 37.5-25 MG PO TABS: 1.0000 | ORAL_TABLET | Freq: Every day | ORAL | 1 refills | Status: DC

## 2019-03-13 MED ORDER — PRAVASTATIN SODIUM 40 MG PO TABS: 40.0000 mg | ORAL_TABLET | Freq: Every day | ORAL | 1 refills | Status: DC

## 2019-03-13 NOTE — Progress Notes (Signed)
Patient: Rachael Gilmore Female    DOB: 1966/03/01   53 y.o.   MRN: [id number] Visit Date: 03/13/2019  Today's Provider: Mila Merryonald Imanie Darrow, MD   No chief complaint on file.  Subjective:     HPI   Virtual Visit via Telephone Note  I connected with Rachael RouteAngela M Calip on 03/13/19 at  4:00 PM EDT by telephone and verified that I am speaking with the correct person using two identifiers.  Location: Patient: Home Provider: [facility]   I discussed the limitations, risks, security and privacy concerns of performing an evaluation and management service by telephone and the availability of in person appointments. I also discussed with the patient that there may be a patient responsible charge related to this service. The patient expressed understanding and agreed to proceed.    Hypertension, follow-up:  BP Readings from Last 3 Encounters:  11/06/17 116/64  08/16/17 112/78  11/01/16 127/74    She was last seen for hypertension 6 months ago.  BP at that visit was 112/78. Management since that visit includes no changes. She reports excellent compliance with treatment. She is not having side effects.  She is not exercising. She is not adherent to low salt diet.   Outside blood pressures are not being check. She is experiencing none.  Patient denies chest pain, dyspnea and palpitations.   Cardiovascular risk factors include hypertension.  Use of agents associated with hypertension: none.     Weight trend: increasing steadily Wt Readings from Last 3 Encounters:  11/05/17 197 lb (89.4 kg)  08/16/17 194 lb (88 kg)  11/01/16 194 lb 9.6 oz (88.3 kg)    Current diet: in general, an "unhealthy" diet 142/92 off of medications. Not exercising  Wt up 222.   Out of pravastatin for several months months ago Stopped zetia per cardiologist.  By patient report. She states she did take higher dose of pravastatin in the past and tolerated well.   ------------------------------------------------------------------------  Allergies  Allergen Reactions  . Atorvastatin     Severe muscle aches and weakness.      Current Outpatient Medications:  .  ALPRAZolam (XANAX) 0.5 MG tablet, TAKE 1/2 TO 1 (ONE-HALF TO ONE) TABLET BY MOUTH TWICE DAILY AS NEEDED, Disp: 60 tablet, Rfl: 3 .  aspirin 325 MG tablet, Take 1 tablet by mouth daily., Disp: , Rfl:  .  Coenzyme Q10 (CO Q 10) 100 MG CAPS, Take 1 capsule by mouth daily., Disp: , Rfl:  .  dicyclomine (BENTYL) 10 MG capsule, Take 1 capsule (10 mg total) by mouth 3 (three) times daily as needed for up to 5 days for spasms (or cramping)., Disp: 15 capsule, Rfl: 0 .  loperamide (IMODIUM A-D) 2 MG tablet, Take 1 tablet (2 mg total) by mouth 4 (four) times daily as needed for diarrhea or loose stools., Disp: 20 tablet, Rfl: 0 .  Multiple Vitamin (MULTI-VITAMINS) TABS, Take 1 tablet by mouth daily., Disp: , Rfl:  .  nystatin ointment (MYCOSTATIN), Apply affected area twice daily as needed, Disp: 30 g, Rfl: 3 .  pravastatin (PRAVACHOL) 20 MG tablet, Take 1 tablet (20 mg total) by mouth daily., Disp: 90 tablet, Rfl: 4 .  terbinafine (LAMISIL) 250 MG tablet, Take 1 tablet (250 mg total) by mouth daily., Disp: 90 tablet, Rfl: 1 .  triamterene-hydrochlorothiazide (MAXZIDE-25) 37.5-25 MG tablet, TAKE 1 TABLET BY MOUTH ONCE DAILY, Disp: 90 tablet, Rfl: 0  Review of Systems  Constitutional: Negative for appetite change,  chills, fatigue and fever.  Respiratory: Negative for chest tightness and shortness of breath.   Cardiovascular: Negative for chest pain and palpitations.  Gastrointestinal: Negative for abdominal pain, nausea and vomiting.  Neurological: Negative for dizziness and weakness.    Social History   Tobacco Use  . Smoking status: Former Smoker    Quit date: 03/08/2009    Years since quitting: 10.0  . Smokeless tobacco: Never Used  Substance Use Topics  . Alcohol use: Yes    Comment:  socially      Objective:   BP (!) 142/92   Ht 5\' 3"  (1.6 m)   Wt 226 lb (102.5 kg)   BMI 40.03 kg/m    Physical Exam   Awake, alert in NAD     Assessment & Plan    1. Hyperlipidemia, unspecified hyperlipidemia type Currently out of pravastatin. Is to restart at 40mg  and will check lipids in about 6 weeks.   2. Vitamin D deficiency Check vitamin d with labs in September.   3. Essential hypertension Had been well controlled before running out of medications. Restart today.   4. Left-sided carotid artery disease, unspecified type ([place]) Asymptomatic. Compliant with medication.  Continue aggressive risk factor modification.      I discussed the assessment and treatment plan with the patient. The patient was provided an opportunity to ask questions and all were answered. The patient agreed with the plan and demonstrated an understanding of the instructions.   The patient was advised to call back or seek an in-person evaluation if the symptoms worsen or if the condition fails to improve as anticipated.  I provided 12 minutes of non-face-to-face time during this encounter.      Lelon Huh, MD  [facility]

## 2019-03-13 NOTE — Patient Instructions (Signed)
.   Please review the attached list of medications and notify my office if there are any errors.   . Please bring all of your medications to every appointment so we can make sure that our medication list is the same as yours.   . We will have flu vaccines available after Labor Day. Please go to your pharmacy or call the office in early September to schedule you flu shot.   

## 2019-05-04 ENCOUNTER — Telehealth: Payer: Self-pay | Admitting: Family Medicine

## 2019-05-04 DIAGNOSIS — E559 Vitamin D deficiency, unspecified: Secondary | ICD-10-CM

## 2019-05-04 DIAGNOSIS — E785 Hyperlipidemia, unspecified: Secondary | ICD-10-CM

## 2019-05-04 NOTE — Telephone Encounter (Signed)
Please advise that it is time to check lipids, kidney and liver functions since starting back on pravastatin in august. Please print and leave order at lab. She needs to be fasting.

## 2019-05-04 NOTE — Telephone Encounter (Signed)
[id number] 

## 2019-05-08 NOTE — Telephone Encounter (Signed)
Patient advised as below.  

## 2019-07-24 ENCOUNTER — Other Ambulatory Visit: Payer: Self-pay | Admitting: Family Medicine

## 2019-07-24 DIAGNOSIS — F419 Anxiety disorder, unspecified: Secondary | ICD-10-CM

## 2019-07-24 NOTE — Telephone Encounter (Signed)
Requested medication (s) are due for refill today:yes  Requested medication (s) are on the active medication list: yes  Last refill: 01/12/2019  #60  3 refills  Future visit scheduled no  Notes to clinic:Not delegated  Requested Prescriptions  Pending Prescriptions Disp Refills   ALPRAZolam (XANAX) 0.5 MG tablet [Pharmacy Med Name: ALPRAZolam 0.5 MG Oral Tablet] 60 tablet 0    Sig: TAKE 1/2 TO 1 (ONE-HALF TO ONE) TABLET BY MOUTH TWICE DAILY AS NEEDED      Not Delegated - Psychiatry:  Anxiolytics/Hypnotics Failed - 07/24/2019 10:45 AM      Failed - This refill cannot be delegated      Failed - Urine Drug Screen completed in last 360 days.      Passed - Valid encounter within last 6 months    Recent Outpatient Visits           4 months ago Hyperlipidemia, unspecified hyperlipidemia type   [facility] Birdie Sons, MD   1 year ago Annual physical exam   [facility] Birdie Sons, MD   3 years ago Hyperlipidemia, unspecified hyperlipidemia type   [facility] Birdie Sons, MD   3 years ago Annual physical exam   [facility] Margarita Rana, MD   4 years ago Benign hypertension   [facility] Margarita Rana, MD

## 2019-08-08 ENCOUNTER — Ambulatory Visit: Payer: BC Managed Care – PPO | Attending: Internal Medicine

## 2019-08-08 DIAGNOSIS — Z20828 Contact with and (suspected) exposure to other viral communicable diseases: Secondary | ICD-10-CM | POA: Diagnosis not present

## 2019-08-08 DIAGNOSIS — Z20822 Contact with and (suspected) exposure to covid-19: Secondary | ICD-10-CM

## 2019-08-09 ENCOUNTER — Telehealth: Payer: BC Managed Care – PPO | Admitting: Emergency Medicine

## 2019-08-09 DIAGNOSIS — R0602 Shortness of breath: Secondary | ICD-10-CM

## 2019-08-09 DIAGNOSIS — Z20822 Contact with and (suspected) exposure to covid-19: Secondary | ICD-10-CM

## 2019-08-09 NOTE — Progress Notes (Signed)
  E-Visit for [employer] Virus Screening  Based on what you have shared with me, you need to seek an evaluation for a severe illness that is causing your symptoms which may be coronavirus or some other illness.   You will need to be evaluated in person so that your oxygen level can be monitored.  You may also need a chest x-ray or lab testing.  I recommend that you be seen and evaluated "face to face". If you are considered high risk for Corona virus because of a known exposure, fever, shortness of breath and cough, OR if you have severe symptoms of any kind, seek medical care at an emergency room. Our Emergency Departments are best equipped to handle patients with severe symptoms.  You will be evaluated by the ER provider (or higher level of care provider) who will determine whether you need formal testing.  If you are having a true medical emergency please call 911.   I recommend the following:  . [facility] Emergency Department [place], [place], [place] [address] [phone]  . [facility] Emergency Department [place], [place], [place] [address] [phone]  . [facility] Emergency Department [place], [place], [place] [address] [phone]  . [facility] Emergency Department [address] [place], [place], [place] [address] [phone]  . [facility] Emergency Department [place], [place],  [address] [phone]  NOTE: If you entered your credit card information for this eVisit, you will not be charged. You may see a "hold" on your card for the $35 but that hold will drop off and you will not have a charge processed.   Your e-visit answers were reviewed by a board certified advanced clinical practitioner to complete your personal care plan.  Thank you for using e-Visits.  Approximately 5 minutes was used in reviewing the patient's chart,  questionnaire, prescribing medications, and documentation.

## 2019-08-10 LAB — NOVEL CORONAVIRUS, NAA: SARS-CoV-2, NAA: DETECTED — AB

## 2019-08-15 ENCOUNTER — Encounter: Payer: Self-pay | Admitting: Family Medicine

## 2019-08-15 ENCOUNTER — Ambulatory Visit (INDEPENDENT_AMBULATORY_CARE_PROVIDER_SITE_OTHER): Payer: BC Managed Care – PPO | Admitting: Family Medicine

## 2019-08-15 VITALS — Temp 99.8°F

## 2019-08-15 DIAGNOSIS — R531 Weakness: Secondary | ICD-10-CM

## 2019-08-15 DIAGNOSIS — U071 COVID-19: Secondary | ICD-10-CM

## 2019-08-15 DIAGNOSIS — R059 Cough, unspecified: Secondary | ICD-10-CM

## 2019-08-15 DIAGNOSIS — R42 Dizziness and giddiness: Secondary | ICD-10-CM

## 2019-08-15 DIAGNOSIS — R05 Cough: Secondary | ICD-10-CM

## 2019-08-15 MED ORDER — AZITHROMYCIN 250 MG PO TABS: ORAL_TABLET | ORAL | 0 refills | Status: DC

## 2019-08-15 MED ORDER — ALBUTEROL SULFATE HFA 108 (90 BASE) MCG/ACT IN AERS: 1.0000 | INHALATION_SPRAY | Freq: Four times a day (QID) | RESPIRATORY_TRACT | 1 refills | Status: DC | PRN

## 2019-08-15 MED ORDER — PREDNISONE 20 MG PO TABS: 20.0000 mg | ORAL_TABLET | Freq: Two times a day (BID) | ORAL | 0 refills | Status: AC

## 2019-08-15 NOTE — Progress Notes (Signed)
Patient: Rachael Gilmore Female    DOB: 10/14/1965   54 y.o.   MRN: [id number] Visit Date: 08/15/2019  Today's Provider: Lelon Huh, MD   Chief Complaint  Patient presents with  . Cough   Subjective:     Cough This is a new problem. Episode onset: 8 days ago. The problem has been rapidly worsening. Associated symptoms include chest pain, chills, a fever (up to 100.7), headaches, myalgias, a sore throat, shortness of breath, sweats and wheezing.  Patient tested positive for Coronavirus on 08/08/2019. She symptoms started on 08-07-2019. She had been exposed on 08-03-2019. She states her worst symptoms were on Sunday and Monday this week. Felt slightly better yesterday and today, but still having fairly severe body aches, extreme fatigue and dizzy when standing. She has had mild dry cough and gets short of breath when coughing, but O2 saturations have been in mid 90s on room air.   Allergies  Allergen Reactions  . Atorvastatin     Severe muscle aches and weakness.      Current Outpatient Medications:  .  ALPRAZolam (XANAX) 0.5 MG tablet, TAKE 1/2 TO 1 (ONE-HALF TO ONE) TABLET BY MOUTH TWICE DAILY AS NEEDED, Disp: 60 tablet, Rfl: 2 .  aspirin 325 MG tablet, Take 1 tablet by mouth daily., Disp: , Rfl:  .  Coenzyme Q10 (CO Q 10) 100 MG CAPS, Take 1 capsule by mouth daily., Disp: , Rfl:  .  Multiple Vitamin (MULTI-VITAMINS) TABS, Take 1 tablet by mouth daily., Disp: , Rfl:  .  nystatin ointment (MYCOSTATIN), Apply affected area twice daily as needed, Disp: 30 g, Rfl: 3 .  pravastatin (PRAVACHOL) 40 MG tablet, Take 1 tablet (40 mg total) by mouth daily., Disp: 90 tablet, Rfl: 1 .  triamterene-hydrochlorothiazide (MAXZIDE-25) 37.5-25 MG tablet, Take 1 tablet by mouth daily., Disp: 90 tablet, Rfl: 1 .  dicyclomine (BENTYL) 10 MG capsule, Take 1 capsule (10 mg total) by mouth 3 (three) times daily as needed for up to 5 days for spasms (or cramping)., Disp: 15 capsule, Rfl: 0 .   terbinafine (LAMISIL) 250 MG tablet, Take 1 tablet (250 mg total) by mouth daily. (Patient not taking: Reported on 08/15/2019), Disp: 90 tablet, Rfl: 1  Review of Systems  Constitutional: Positive for chills, fatigue and fever (up to 100.7). Negative for appetite change.  HENT: Positive for sore throat.   Respiratory: Positive for cough, shortness of breath and wheezing. Negative for chest tightness.   Cardiovascular: Positive for chest pain. Negative for palpitations.  Gastrointestinal: Positive for diarrhea. Negative for abdominal pain, nausea and vomiting.  Musculoskeletal: Positive for back pain and myalgias.  Neurological: Positive for headaches. Negative for dizziness and weakness.    Social History   Tobacco Use  . Smoking status: Former Smoker    Quit date: 03/08/2009    Years since quitting: 10.4  . Smokeless tobacco: Never Used  Substance Use Topics  . Alcohol use: Yes    Comment: socially      Objective:   Temp 99.8 F (37.7 C)  Vitals:   08/15/19 1026  Temp: 99.8 F (37.7 C)  There is no height or weight on file to calculate BMI.   Physical Exam  Awake, alert, oriented x 3. In no apparent distress      Assessment & Plan    1. COVID-19 virus infection   2. Cough   3. Weakness   4. Dizziness  Slight improvement the last two days  but still with significant symptoms. Reviewed indications for Regeneron treatments in detail including the 10 day time frame which she is nearing the end of. She declined referral due to not feeling like driving at this time.   - predniSONE (DELTASONE) 20 MG tablet; Take 1 tablet (20 mg total) by mouth 2 (two) times daily with a meal for 5 days.  Dispense: 10 tablet; Refill: 0 - azithromycin (ZITHROMAX) 250 MG tablet; 2 by mouth today, then 1 daily for 4 days  Dispense: 6 tablet; Refill: 0 - albuterol (VENTOLIN HFA) 108 (90 Base) MCG/ACT inhaler; Inhale 1-2 puffs into the lungs every 6 (six) hours as needed for shortness of  breath.  Dispense: 18 g; Refill: 1   Call if symptoms change or if not rapidly improving.      Mila Merry, MD  [facility]

## 2019-08-15 NOTE — Patient Instructions (Signed)
.   Please review the attached list of medications and notify my office if there are any errors.   . Please bring all of your medications to every appointment so we can make sure that our medication list is the same as yours.   

## 2019-08-15 NOTE — Telephone Encounter (Signed)
Virtual visit scheduled 11:20 08-15-2019

## 2019-12-04 ENCOUNTER — Other Ambulatory Visit: Payer: Self-pay | Admitting: Family Medicine

## 2019-12-04 DIAGNOSIS — E785 Hyperlipidemia, unspecified: Secondary | ICD-10-CM

## 2019-12-04 DIAGNOSIS — I779 Disorder of arteries and arterioles, unspecified: Secondary | ICD-10-CM

## 2020-01-05 ENCOUNTER — Other Ambulatory Visit: Payer: Self-pay | Admitting: Family Medicine

## 2020-01-05 DIAGNOSIS — I10 Essential (primary) hypertension: Secondary | ICD-10-CM

## 2020-01-05 NOTE — Telephone Encounter (Signed)
Requested Prescriptions  Pending Prescriptions Disp Refills  . triamterene-hydrochlorothiazide (MAXZIDE-25) 37.5-25 MG tablet [Pharmacy Med Name: Triamterene-HCTZ 37.5-25 MG Oral Tablet] 90 tablet 0    Sig: Take 1 tablet by mouth once daily     Cardiovascular: Diuretic Combos Failed - 01/05/2020  9:25 AM      Failed - K in normal range and within 360 days    Potassium  Date Value Ref Range Status  11/05/2017 3.4 (L) 3.5 - 5.1 mmol/L Final         Failed - Na in normal range and within 360 days    Sodium  Date Value Ref Range Status  11/05/2017 136 135 - 145 mmol/L Final  11/11/2015 142 134 - 144 mmol/L Final         Failed - Cr in normal range and within 360 days    Creatinine, Ser  Date Value Ref Range Status  11/05/2017 0.69 0.44 - 1.00 mg/dL Final         Failed - Ca in normal range and within 360 days    Calcium  Date Value Ref Range Status  11/05/2017 9.5 8.9 - 10.3 mg/dL Final         Failed - Last BP in normal range    BP Readings from Last 1 Encounters:  03/13/19 (!) 142/92         Passed - Valid encounter within last 6 months    Recent Outpatient Visits          4 months ago COVID-19 virus infection   [facility] Malva Limes, MD   9 months ago Hyperlipidemia, unspecified hyperlipidemia type   [facility] Malva Limes, MD   2 years ago Annual physical exam   [facility] Malva Limes, MD   3 years ago Hyperlipidemia, unspecified hyperlipidemia type   [facility] Malva Limes, MD   4 years ago Annual physical exam   [facility] Lorie Phenix, MD

## 2020-05-29 ENCOUNTER — Other Ambulatory Visit: Payer: Self-pay | Admitting: Family Medicine

## 2020-05-29 DIAGNOSIS — F419 Anxiety disorder, unspecified: Secondary | ICD-10-CM

## 2020-05-29 DIAGNOSIS — E785 Hyperlipidemia, unspecified: Secondary | ICD-10-CM

## 2020-05-29 DIAGNOSIS — I779 Disorder of arteries and arterioles, unspecified: Secondary | ICD-10-CM

## 2020-05-29 DIAGNOSIS — I10 Essential (primary) hypertension: Secondary | ICD-10-CM

## 2020-05-29 NOTE — Telephone Encounter (Signed)
Requested medication (s) are due for refill today no  Requested medication (s) are on the active medication list:yes  Last refill: 01/05/2020  Future visit scheduled:no  Notes to clinic:  overdue for a  6 month follow up appointment     Requested Prescriptions  Pending Prescriptions Disp Refills   triamterene-hydrochlorothiazide (MAXZIDE-25) 37.5-25 MG tablet [Pharmacy Med Name: Triamterene-HCTZ 37.5-25 MG Oral Tablet] 90 tablet 0    Sig: Take 1 tablet by mouth once daily      Cardiovascular: Diuretic Combos Failed - 05/29/2020  7:44 PM      Failed - K in normal range and within 360 days    Potassium  Date Value Ref Range Status  11/05/2017 3.4 (L) 3.5 - 5.1 mmol/L Final          Failed - Na in normal range and within 360 days    Sodium  Date Value Ref Range Status  11/05/2017 136 135 - 145 mmol/L Final  11/11/2015 142 134 - 144 mmol/L Final          Failed - Cr in normal range and within 360 days    Creatinine, Ser  Date Value Ref Range Status  11/05/2017 0.69 0.44 - 1.00 mg/dL Final          Failed - Ca in normal range and within 360 days    Calcium  Date Value Ref Range Status  11/05/2017 9.5 8.9 - 10.3 mg/dL Final          Failed - Last BP in normal range    BP Readings from Last 1 Encounters:  03/13/19 (!) 142/92          Failed - Valid encounter within last 6 months    Recent Outpatient Visits           9 months ago COVID-19 virus infection   [facility] Malva Limes, MD   1 year ago Hyperlipidemia, unspecified hyperlipidemia type   [facility] Malva Limes, MD   2 years ago Annual physical exam   [facility] Malva Limes, MD   3 years ago Hyperlipidemia, unspecified hyperlipidemia type   [facility] Malva Limes, MD   4 years ago Annual physical exam   [facility] Lorie Phenix, MD                ALPRAZolam Prudy Feeler) 0.5 MG tablet [Pharmacy Med  Name: ALPRAZolam 0.5 MG Oral Tablet] 60 tablet 0    Sig: TAKE 1/2 TO 1 (ONE-HALF TO ONE) TABLET BY MOUTH TWICE DAILY AS NEEDED      Not Delegated - Psychiatry:  Anxiolytics/Hypnotics Failed - 05/29/2020  7:44 PM      Failed - This refill cannot be delegated      Failed - Urine Drug Screen completed in last 360 days.      Failed - Valid encounter within last 6 months    Recent Outpatient Visits           9 months ago COVID-19 virus infection   [facility] Malva Limes, MD   1 year ago Hyperlipidemia, unspecified hyperlipidemia type   [facility] Malva Limes, MD   2 years ago Annual physical exam   [facility] Malva Limes, MD   3 years ago Hyperlipidemia, unspecified hyperlipidemia type   [facility] Malva Limes, MD   4 years ago Annual physical exam   [facility],  Harriett Sine, MD                pravastatin (PRAVACHOL) 40 MG tablet [Pharmacy Med Name: Pravastatin Sodium 40 MG Oral Tablet] 90 tablet 0    Sig: TAKE 1 TABLET BY MOUTH ONCE DAILY (SCHEDULE  FOLLOW  UP)      Cardiovascular:  Antilipid - Statins Failed - 05/29/2020  7:44 PM      Failed - Total Cholesterol in normal range and within 360 days    Cholesterol, Total  Date Value Ref Range Status  09/24/2016 164 100 - 199 mg/dL Final          Failed - LDL in normal range and within 360 days    LDL Calculated  Date Value Ref Range Status  09/24/2016 101 (H) 0 - 99 mg/dL Final          Failed - HDL in normal range and within 360 days    HDL  Date Value Ref Range Status  09/24/2016 41 >39 mg/dL Final          Failed - Triglycerides in normal range and within 360 days    Triglycerides  Date Value Ref Range Status  09/24/2016 111 0 - 149 mg/dL Final          Passed - Patient is not pregnant      Passed - Valid encounter within last 12 months    Recent Outpatient Visits           9 months ago COVID-19 virus  infection   [facility] Malva Limes, MD   1 year ago Hyperlipidemia, unspecified hyperlipidemia type   [facility] Malva Limes, MD   2 years ago Annual physical exam   [facility] Malva Limes, MD   3 years ago Hyperlipidemia, unspecified hyperlipidemia type   [facility] Malva Limes, MD   4 years ago Annual physical exam   [facility] Lorie Phenix, MD

## 2020-05-30 NOTE — Telephone Encounter (Signed)
Patient is overdue for follow up, please advise she needs to schedule appointment before any medications can be refilled.

## 2020-06-02 NOTE — Telephone Encounter (Signed)
Patient advised. Follow up appointment scheduled for 06/18/2020 at 10:40am. Patient will run out of medications before her follow up appointment. Patient is asking for a 90 day supply courtesy refill for her medications. She states that getting a 90 day supply is much cheaper than a 30 day supply. Patient promises to keep the scheduled appointment. Patient states she is trying to save money, and the 90 day supply would be more cost effective for her. Please advise.

## 2020-06-11 ENCOUNTER — Ambulatory Visit
Admission: RE | Admit: 2020-06-11 | Discharge: 2020-06-11 | Disposition: A | Discharge status: Home / Self Care | Payer: BC Managed Care – PPO | Attending: Adult Health | Admitting: Adult Health

## 2020-06-11 ENCOUNTER — Ambulatory Visit: Payer: Self-pay

## 2020-06-11 ENCOUNTER — Telehealth (INDEPENDENT_AMBULATORY_CARE_PROVIDER_SITE_OTHER): Payer: BC Managed Care – PPO | Admitting: Adult Health

## 2020-06-11 ENCOUNTER — Telehealth: Payer: Self-pay | Admitting: Family Medicine

## 2020-06-11 ENCOUNTER — Other Ambulatory Visit: Payer: Self-pay | Admitting: Adult Health

## 2020-06-11 ENCOUNTER — Ambulatory Visit
Admission: RE | Admit: 2020-06-11 | Discharge: 2020-06-11 | Disposition: A | Discharge status: Home / Self Care | Payer: BC Managed Care – PPO | Source: Ambulatory Visit | Attending: Adult Health | Admitting: Adult Health

## 2020-06-11 ENCOUNTER — Encounter: Payer: Self-pay | Admitting: Adult Health

## 2020-06-11 VITALS — Temp 97.3°F | Wt 221.0 lb

## 2020-06-11 DIAGNOSIS — J050 Acute obstructive laryngitis [croup]: Secondary | ICD-10-CM | POA: Diagnosis not present

## 2020-06-11 DIAGNOSIS — R059 Cough, unspecified: Secondary | ICD-10-CM | POA: Diagnosis not present

## 2020-06-11 DIAGNOSIS — J189 Pneumonia, unspecified organism: Secondary | ICD-10-CM | POA: Diagnosis not present

## 2020-06-11 MED ORDER — GUAIFENESIN-CODEINE 100-10 MG/5ML PO SYRP: 5.0000 mL | ORAL_SOLUTION | Freq: Every evening | ORAL | 0 refills | Status: DC | PRN

## 2020-06-11 MED ORDER — AZITHROMYCIN 250 MG PO TABS: ORAL_TABLET | ORAL | 0 refills | Status: AC

## 2020-06-11 MED ORDER — PREDNISONE 10 MG (21) PO TBPK: ORAL_TABLET | ORAL | 0 refills | Status: DC

## 2020-06-11 MED ORDER — BENZONATATE 100 MG PO CAPS: 100.0000 mg | ORAL_CAPSULE | Freq: Two times a day (BID) | ORAL | 0 refills | Status: DC | PRN

## 2020-06-11 NOTE — Telephone Encounter (Signed)
Patient had a video visit today with Rachael Gilmore,. Patient would like cough syrup instead of pill form, patient would like rx sent in today, please advise   [facility] [address], [place] - [address] Phone:  [phone]  Fax:  [phone]

## 2020-06-11 NOTE — Telephone Encounter (Signed)
Meds ordered this encounter  Medications   guaiFENesin-codeine (ROBITUSSIN AC) 100-10 MG/5ML syrup    Sig: Take 5 mLs by mouth at bedtime as needed for cough.    Dispense:  118 mL    Refill:  0  Sent as above.

## 2020-06-11 NOTE — Progress Notes (Signed)
Meds ordered this encounter  Medications  . guaiFENesin-codeine (ROBITUSSIN AC) 100-10 MG/5ML syrup    Sig: Take 5 mLs by mouth at bedtime as needed for cough.    Dispense:  118 mL    Refill:  0   Medications Discontinued During This Encounter  Medication Reason  . benzonatate (TESSALON) 100 MG capsule Completed Course

## 2020-06-11 NOTE — Patient Instructions (Addendum)
Upper Respiratory Infection, Adult An upper respiratory infection (URI) affects the nose, throat, and upper air passages. URIs are caused by germs (viruses). The most common type of URI is often called "the common cold." Medicines cannot cure URIs, but you can do things at home to relieve your symptoms. URIs usually get better within 7-10 days. Follow these instructions at home: Activity  Rest as needed.  If you have a fever, stay home from work or school until your fever is gone, or until your doctor says you may return to work or school. ? You should stay home until you cannot spread the infection anymore (you are not contagious). ? Your doctor may have you wear a face mask so you have less risk of spreading the infection. Relieving symptoms  Gargle with a salt-water mixture 3-4 times a day or as needed. To make a salt-water mixture, completely dissolve -1 tsp of salt in 1 cup of warm water.  Use a cool-mist humidifier to add moisture to the air. This can help you breathe more easily. Eating and drinking   Drink enough fluid to keep your pee (urine) pale yellow.  Eat soups and other clear broths. General instructions   Take over-the-counter and prescription medicines only as told by your doctor. These include cold medicines, fever reducers, and cough suppressants.  Do not use any products that contain nicotine or tobacco. These include cigarettes and e-cigarettes. If you need help quitting, ask your doctor.  Avoid being where people are smoking (avoid secondhand smoke).  Make sure you get regular shots and get the flu shot every year.  Keep all follow-up visits as told by your doctor. This is important. How to avoid spreading infection to others   Wash your hands often with soap and water. If you do not have soap and water, use hand sanitizer.  Avoid touching your mouth, face, eyes, or nose.  Cough or sneeze into a tissue or your sleeve or elbow. Do not cough or sneeze  into your hand or into the air. Contact a doctor if:  You are getting worse, not better.  You have any of these: ? A fever. ? Chills. ? Brown or red mucus in your nose. ? Yellow or brown fluid (discharge)coming from your nose. ? Pain in your face, especially when you bend forward. ? Swollen neck glands. ? Pain with swallowing. ? White areas in the back of your throat. Get help right away if:  You have shortness of breath that gets worse.  You have very bad or constant: ? Headache. ? Ear pain. ? Pain in your forehead, behind your eyes, and over your cheekbones (sinus pain). ? Chest pain.  You have long-lasting (chronic) lung disease along with any of these: ? Wheezing. ? Long-lasting cough. ? Coughing up blood. ? A change in your usual mucus.  You have a stiff neck.  You have changes in your: ? Vision. ? Hearing. ? Thinking. ? Mood. Summary  An upper respiratory infection (URI) is caused by a germ called a virus. The most common type of URI is often called "the common cold."  URIs usually get better within 7-10 days.  Take over-the-counter and prescription medicines only as told by your doctor. This information is not intended to replace advice given to you by your health care provider. Make sure you discuss any questions you have with your health care provider. Document Revised: 08/03/2018 Document Reviewed: 03/18/2017 Elsevier Patient Education  2020 [employer]. Acute Bronchitis, Adult  Acute bronchitis is when air tubes in the lungs (bronchi) suddenly get swollen. The condition can make it hard for you to breathe. In adults, acute bronchitis usually goes away within 2 weeks. A cough caused by bronchitis may last up to 3 weeks. Smoking, allergies, and asthma can make the condition worse. What are the causes? This condition is caused by:  Cold and flu viruses. The most common cause of this condition is the virus that causes the common  cold.  Bacteria.  Substances that irritate the lungs, including: ? Smoke from cigarettes and other types of tobacco. ? Dust and pollen. ? Fumes from chemicals, gases, or burned fuel. ? Other materials that pollute indoor or outdoor air.  Close contact with someone who has acute bronchitis. What increases the risk? The following factors may make you more likely to develop this condition:  A weak body's defense system. This is also called the immune system.  Any condition that affects your lungs and breathing, such as asthma. What are the signs or symptoms? Symptoms of this condition include:  A cough.  Coughing up clear, yellow, or green mucus.  Wheezing.  Chest congestion.  Shortness of breath.  A fever.  Body aches.  Chills.  A sore throat. How is this treated? Acute bronchitis may go away over time without treatment. Your doctor may recommend:  Drinking more fluids.  Taking a medicine for a fever or cough.  Using a device that gets medicine into your lungs (inhaler).  Using a vaporizer or a humidifier. These are machines that add water or moisture in the air to help with coughing and poor breathing. Follow these instructions at home:  Activity  Get a lot of rest.  Avoid places where there are fumes from chemicals.  Return to your normal activities as told by your doctor. Ask your doctor what activities are safe for you. Lifestyle  Drink enough fluids to keep your pee (urine) pale yellow.  Do not drink alcohol.  Do not use any products that contain nicotine or tobacco, such as cigarettes, e-cigarettes, and chewing tobacco. If you need help quitting, ask your doctor. Be aware that: ? Your bronchitis will get worse if you smoke or breathe in other people's smoke (secondhand smoke). ? Your lungs will heal faster if you quit smoking. General instructions  Take over-the-counter and prescription medicines only as told by your doctor.  Use an inhaler,  cool mist vaporizer, or humidifier as told by your doctor.  Rinse your mouth often with salt water. To make salt water, dissolve -1 tsp (3-6 g) of salt in 1 cup (237 mL) of warm water.  Keep all follow-up visits as told by your doctor. This is important. How is this prevented? To lower your risk of getting this condition again:  Wash your hands often with soap and water. If soap and water are not available, use hand sanitizer.  Avoid contact with people who have cold symptoms.  Try not to touch your mouth, nose, or eyes with your hands.  Make sure to get the flu shot every year. Contact a doctor if:  Your symptoms do not get better in 2 weeks.  You vomit more than once or twice.  You have symptoms of loss of fluid from your body (dehydration). These include: ? Dark urine. ? Dry skin or eyes. ? Increased thirst. ? Headaches. ? Confusion. ? Muscle cramps. Get help right away if:  You cough up blood.  You have chest pain.  You  have very bad shortness of breath.  You become dehydrated.  You faint or keep feeling like you are going to faint.  You keep vomiting.  You have a very bad headache.  Your fever or chills get worse. These symptoms may be an emergency. Do not wait to see if the symptoms will go away. Get medical help right away. Call your local emergency services (911 in the [place] Do not drive yourself to the hospital. Summary  Acute bronchitis is when air tubes in the lungs (bronchi) suddenly get swollen. In adults, acute bronchitis usually goes away within 2 weeks.  Take over-the-counter and prescription medicines only as told by your doctor.  Drink enough fluid to keep your pee (urine) pale yellow.  Contact a doctor if your symptoms do not improve after 2 weeks of treatment.  Get help right away if you cough up blood, faint, or have chest pain or shortness of breath. This information is not intended to replace advice given to you by your health care  provider. Make sure you discuss any questions you have with your health care provider. Document Revised: 02/16/2019 Document Reviewed: 02/16/2019 Elsevier Patient Education  2020 [employer].

## 2020-06-11 NOTE — Progress Notes (Signed)
MyChart Video Visit    Virtual Visit via Video Note   This visit type was conducted due to national recommendations for restrictions regarding the COVID-19 Pandemic (e.g. social distancing) in an effort to limit this patient's exposure and mitigate transmission in our community. This patient is at least at moderate risk for complications without adequate follow up. This format is felt to be most appropriate for this patient at this time. Physical exam was limited by quality of the video and audio technology used for the visit.   Patient location: at home  Provider location: Provider: Provider's office at  [facility], [place].     I discussed the limitations of evaluation and management by telemedicine and the availability of in person appointments. The patient expressed understanding and agreed to proceed.  Patient: Rachael Gilmore   DOB: 01-Jan-1966   54 y.o. Female  MRN: [id number] Visit Date: 06/11/2020  Today's healthcare provider: Jairo Ben, FNP   Chief Complaint  Patient presents with  . Cough   Subjective    Cough This is a new problem. The current episode started 1 to 4 weeks ago (patient states onset began a week ago with pain in her back that she describes as being at her left lung). The problem has been gradually worsening. The cough is non-productive. Associated symptoms include chest pain, a sore throat and shortness of breath. Pertinent negatives include no chills, ear congestion, ear pain, fever, headaches, heartburn, hemoptysis, myalgias, nasal congestion, postnasal drip, rash, rhinorrhea, sweats, weight loss or wheezing. She has tried steroid inhaler and OTC cough suppressant for the symptoms. The treatment provided mild relief. Her past medical history is significant for bronchitis and pneumonia. There is no history of asthma, bronchiectasis, COPD, emphysema or environmental allergies.   She has been taking coricidin.   Onset was  06/05/20.  She thought she has worked out to hard last week and started with pain in her left lung not worsening. She tries to actually work out again.  Oxygen 98 percent B/P  134/85 Heart rate 68. Temperature 97.3 reported vitals.   She has not been covid tested. Denies any exposures.  She works at [employer].  History of bronchitis, asthma, and pneumonia.   Patient  denies any fever, body aches,chills, rash, chest pain, shortness of breath, nausea, vomiting, or diarrhea.  Denies dizziness, lightheadedness, pre syncopal or syncopal episodes.   Last Covid had covid. No covid vaccines. Reaction to shingles shot had to go EMS to hospital. Not going to take.   Patient Active Problem List   Diagnosis Date Noted  . Atypical pneumonia 06/11/2020  . Cough 06/11/2020  . Large breasts 07/21/2015  . Seasonal affective disorder (HCC) 07/21/2015  . Tinea corporis 07/21/2015  . Hyperlipemia 07/21/2015  . Major depression in remission (HCC) 04/17/2015  . AF (amaurosis fugax) 04/16/2015  . Irregular bleeding 04/16/2015  . Headache, migraine 04/16/2015  . Obesity 04/16/2015  . Fungal infection of nail 04/16/2015  . Vitamin D deficiency 04/16/2015  . Anxiety 02/06/2015  . Carotid artery disease (HCC) 08/11/2010  . History of secondary hyperparathyroidism 06/11/2010  . OSA on CPAP 01/24/2010  . Hairiness 11/17/2009  . Essential hypertension 03/07/2009  . Palpitations 11/11/2008   Past Medical History:  Diagnosis Date  . Anxiety   . Depression   . Hyperlipidemia   . Hypertension   . Macromastia   . Sleep apnea    not used CPAP in 6 yrs  . Stroke (HCC)   .  TIA (transient ischemic attack) 01/14/2016   amaurosis fugax. [facility]. MRI brain unremarkable   Past Surgical History:  Procedure Laterality Date  . BREAST REDUCTION SURGERY Bilateral 11/01/2016   Procedure: BILATERAL BREAST REDUCTION WITH LIPOSUCTION;  Surgeon: Louisa Second, MD;  Location: [facility];  Service: Plastics;   Laterality: Bilateral;  . BREAST SURGERY  07/2017   Revision of breast   . CAROTID ENDARTERECTOMY Left 02/07/2009   [facility]  . CESAREAN SECTION  1992 and 1994  . KENALOG INJECTION Left 11/01/2016   Procedure: KENALOG INJECTION TO BILATERAL BREAST INCISIONS AND LEFT NECK SCAR;  Surgeon: Louisa Second, MD;  Location: [facility];  Service: Plastics;  Laterality: Left;  . NECK SURGERY  07/26/2017   scar revision on left side of neck  . TONSILLECTOMY  1969   Social History   Tobacco Use  . Smoking status: Former Smoker    Quit date: 03/08/2009    Years since quitting: 11.2  . Smokeless tobacco: Never Used  Substance Use Topics  . Alcohol use: Yes    Comment: socially  . Drug use: No   Social History   Socioeconomic History  . Marital status: Legally Separated    Spouse name: Not on file  . Number of children: 2  . Years of education: 98  . Highest education level: Not on file  Occupational History    Comment: 2 part time jobs, at [employer]. [employer], and [employer]  Tobacco Use  . Smoking status: Former Smoker    Quit date: 03/08/2009    Years since quitting: 11.2  . Smokeless tobacco: Never Used  Substance and Sexual Activity  . Alcohol use: Yes    Comment: socially  . Drug use: No  . Sexual activity: Not on file  Other Topics Concern  . Not on file  Social History Narrative  . Not on file   Social Determinants of Health   Financial Resource Strain:   . Difficulty of Paying Living Expenses: Not on file  Food Insecurity:   . Worried About Programme researcher, broadcasting/film/video in the Last Year: Not on file  . Ran Out of Food in the Last Year: Not on file  Transportation Needs:   . Lack of Transportation (Medical): Not on file  . Lack of Transportation (Non-Medical): Not on file  Physical Activity:   . Days of Exercise per Week: Not on file  . Minutes of Exercise per Session: Not on file  Stress:   . Feeling of Stress : Not on file  Social Connections:   . Frequency of  Communication with Friends and Family: Not on file  . Frequency of Social Gatherings with Friends and Family: Not on file  . Attends Religious Services: Not on file  . Active Member of Clubs or Organizations: Not on file  . Attends Banker Meetings: Not on file  . Marital Status: Not on file  Intimate Partner Violence:   . Fear of Current or Ex-Partner: Not on file  . Emotionally Abused: Not on file  . Physically Abused: Not on file  . Sexually Abused: Not on file   Family Status  Relation Name Status  . Son  Alive  . Son  Alive  . Mother  Alive  . Father  Deceased at age 14       MVA  . Sister  (Not Specified)   Family History  Problem Relation Age of Onset  . Albinism Son   . Autism  Son   . Arrhythmia Mother   . Breast cancer Sister    Allergies  Allergen Reactions  . Zoster Vac Recomb Adjuvanted     Anyaphylaxis.  Seen in ED   . Atorvastatin     Severe muscle aches and weakness.       Medications: Outpatient Medications Prior to Visit  Medication Sig  . albuterol (VENTOLIN HFA) 108 (90 Base) MCG/ACT inhaler Inhale 1-2 puffs into the lungs every 6 (six) hours as needed for shortness of breath.  . ALPRAZolam (XANAX) 0.5 MG tablet TAKE 1/2 TO 1 (ONE-HALF TO ONE) TABLET BY MOUTH TWICE DAILY AS NEEDED  . aspirin 325 MG tablet Take 1 tablet by mouth daily.  . Coenzyme Q10 (CO Q 10) 100 MG CAPS Take 1 capsule by mouth daily.  . Multiple Vitamin (MULTI-VITAMINS) TABS Take 1 tablet by mouth daily.  Marland Kitchen. nystatin ointment (MYCOSTATIN) Apply affected area twice daily as needed  . pravastatin (PRAVACHOL) 40 MG tablet TAKE 1 TABLET BY MOUTH ONCE DAILY (SCHEDULE  FOLLOW  UP)  . triamterene-hydrochlorothiazide (MAXZIDE-25) 37.5-25 MG tablet Take 1 tablet by mouth once daily  . [DISCONTINUED] dicyclomine (BENTYL) 10 MG capsule Take 1 capsule (10 mg total) by mouth 3 (three) times daily as needed for up to 5 days for spasms (or cramping).  . [DISCONTINUED] terbinafine  (LAMISIL) 250 MG tablet Take 1 tablet (250 mg total) by mouth daily. (Patient not taking: Reported on 08/15/2019)   No facility-administered medications prior to visit.    Review of Systems  Constitutional: Negative for chills, fever and weight loss.  HENT: Positive for sore throat. Negative for ear pain, postnasal drip and rhinorrhea.   Respiratory: Positive for cough and shortness of breath. Negative for hemoptysis and wheezing.   Cardiovascular: Positive for chest pain.  Gastrointestinal: Negative for heartburn.  Musculoskeletal: Negative for myalgias.  Skin: Negative for rash.  Allergic/Immunologic: Negative for environmental allergies.  Neurological: Negative for headaches.    Last CBC Lab Results  Component Value Date   WBC 7.3 11/05/2017   HGB 16.3 (H) 11/05/2017   HCT 48.0 (H) 11/05/2017   MCV 87.3 11/05/2017   MCH 29.6 11/05/2017   RDW 12.9 11/05/2017   PLT 200 11/05/2017   Last metabolic panel Lab Results  Component Value Date   GLUCOSE 125 (H) 11/05/2017   NA 136 11/05/2017   K 3.4 (L) 11/05/2017   CL 102 11/05/2017   CO2 23 11/05/2017   BUN 15 11/05/2017   CREATININE 0.69 11/05/2017   GFRNONAA >60 11/05/2017   GFRAA >60 11/05/2017   CALCIUM 9.5 11/05/2017   PROT 8.1 11/05/2017   ALBUMIN 4.8 11/05/2017   LABGLOB 2.6 11/11/2015   AGRATIO 1.8 11/11/2015   BILITOT 0.9 11/05/2017   ALKPHOS 75 11/05/2017   AST 22 11/05/2017   ALT 14 11/05/2017   ANIONGAP 11 11/05/2017      Objective    Temp (!) 97.3 F (36.3 C) (Oral)   Wt 221 lb (100.2 kg)   BMI 39.15 kg/m  BP Readings from Last 3 Encounters:  03/13/19 (!) 142/92  11/06/17 116/64  08/16/17 112/78      Physical Exam    Patient is alert and oriented and responsive to questions Engages in conversation with provider. Speaks in full sentences without any pauses without any shortness of breath or distress.    Assessment & Plan     Cough - Plan: COVID-19, Flu A+B and RSV, predniSONE (STERAPRED  UNI-PAK 21 TAB) 10 MG (21)  TBPK tablet, azithromycin (ZITHROMAX) 250 MG tablet, benzonatate (TESSALON) 100 MG capsule, DG Chest 2 View  Atypical pneumonia  Suspect atypical pneumonia versus bronchitis , will cover, send for chest x ray.  Orders Placed This Encounter  Procedures  . COVID-19, Flu A+B and RSV  . DG Chest 2 View   Testing this afternoon. quarantine advised at least 10 days per [facility] from symptom onset.    Return in about 1 week (around 06/18/2020), or if symptoms worsen or fail to improve, for at any time for any worsening symptoms, Go to Emergency room/ urgent care if worse.     I discussed the assessment and treatment plan with the patient. The patient was provided an opportunity to ask questions and all were answered. The patient agreed with the plan and demonstrated an understanding of the instructions.   The patient was advised to call back or seek an in-person evaluation if the symptoms worsen or if the condition fails to improve as anticipated.  I provided 25 minutes of non-face-to-face time during this encounter. I discussed the limitations of evaluation and management by telemedicine and the availability of in person appointments. The patient expressed understanding and agreed to proceed.   Jairo Ben, FNP [facility] [phone] (phone) [phone] (fax)  [facility]

## 2020-06-11 NOTE — Telephone Encounter (Signed)
Pt. Rep[orts she started having non-productive cough Monday.Having left back pain "at my lung."  Mild shortness of breath. O2 sats 99%. No fever. Pt. Has a virtual visit for today.  Answer Assessment - Initial Assessment Questions 1. ONSET: "When did the cough begin?"      Monday 2. SEVERITY: "How bad is the cough today?"      Moderate 3. SPUTUM: "Describe the color of your sputum" (none, dry cough; clear, white, yellow, green)     None 4. HEMOPTYSIS: "Are you coughing up any blood?" If so ask: "How much?" (flecks, streaks, tablespoons, etc.)     No 5. DIFFICULTY BREATHING: "Are you having difficulty breathing?" If Yes, ask: "How bad is it?" (e.g., mild, moderate, severe)    - MILD: No SOB at rest, mild SOB with walking, speaks normally in sentences, can lay down, no retractions, pulse < 100.    - MODERATE: SOB at rest, SOB with minimal exertion and prefers to sit, cannot lie down flat, speaks in phrases, mild retractions, audible wheezing, pulse 100-120.    - SEVERE: Very SOB at rest, speaks in single words, struggling to breathe, sitting hunched forward, retractions, pulse > 120      Mild 6. FEVER: "Do you have a fever?" If Yes, ask: "What is your temperature, how was it measured, and when did it start?"     No 7. CARDIAC HISTORY: "Do you have any history of heart disease?" (e.g., heart attack, congestive heart failure)      No 8. LUNG HISTORY: "Do you have any history of lung disease?"  (e.g., pulmonary embolus, asthma, emphysema)     Asthma 9. PE RISK FACTORS: "Do you have a history of blood clots?" (or: recent major surgery, recent prolonged travel, bedridden)     No 10. OTHER SYMPTOMS: "Do you have any other symptoms?" (e.g., runny nose, wheezing, chest pain)       Left back pain "at my lung" 11. PREGNANCY: "Is there any chance you are pregnant?" "When was your last menstrual period?"       No 12. TRAVEL: "Have you traveled out of the country in the last month?" (e.g., travel  history, exposures)       No  Protocols used: COUGH - ACUTE NON-PRODUCTIVE-A-AH

## 2020-06-12 NOTE — Progress Notes (Signed)
Normal chest x-ray.

## 2020-06-13 LAB — COVID-19, FLU A+B AND RSV
Influenza A, NAA: NOT DETECTED
Influenza B, NAA: NOT DETECTED
RSV, NAA: NOT DETECTED
SARS-CoV-2, NAA: NOT DETECTED

## 2020-06-13 NOTE — Progress Notes (Signed)
Negative covid, flu a and b and RSV test.  Sent to Mychart.

## 2020-06-18 ENCOUNTER — Encounter: Payer: Self-pay | Admitting: Family Medicine

## 2020-06-18 ENCOUNTER — Other Ambulatory Visit: Payer: Self-pay

## 2020-06-18 ENCOUNTER — Ambulatory Visit (INDEPENDENT_AMBULATORY_CARE_PROVIDER_SITE_OTHER): Payer: BC Managed Care – PPO | Admitting: Family Medicine

## 2020-06-18 VITALS — BP 114/69 | HR 64 | Temp 97.9°F | Resp 18 | Wt 221.0 lb

## 2020-06-18 DIAGNOSIS — Z1159 Encounter for screening for other viral diseases: Secondary | ICD-10-CM | POA: Diagnosis not present

## 2020-06-18 DIAGNOSIS — E559 Vitamin D deficiency, unspecified: Secondary | ICD-10-CM

## 2020-06-18 DIAGNOSIS — E782 Mixed hyperlipidemia: Secondary | ICD-10-CM

## 2020-06-18 DIAGNOSIS — Z8673 Personal history of transient ischemic attack (TIA), and cerebral infarction without residual deficits: Secondary | ICD-10-CM

## 2020-06-18 DIAGNOSIS — G4733 Obstructive sleep apnea (adult) (pediatric): Secondary | ICD-10-CM | POA: Diagnosis not present

## 2020-06-18 DIAGNOSIS — E213 Hyperparathyroidism, unspecified: Secondary | ICD-10-CM | POA: Diagnosis not present

## 2020-06-18 DIAGNOSIS — F325 Major depressive disorder, single episode, in full remission: Secondary | ICD-10-CM

## 2020-06-18 DIAGNOSIS — E079 Disorder of thyroid, unspecified: Secondary | ICD-10-CM | POA: Diagnosis not present

## 2020-06-18 DIAGNOSIS — I10 Essential (primary) hypertension: Secondary | ICD-10-CM

## 2020-06-18 DIAGNOSIS — Z1211 Encounter for screening for malignant neoplasm of colon: Secondary | ICD-10-CM

## 2020-06-18 DIAGNOSIS — Z8616 Personal history of COVID-19: Secondary | ICD-10-CM

## 2020-06-18 NOTE — Patient Instructions (Addendum)
Please call the [facility] ([phone]) to schedule a routine screening mammogram.  

## 2020-06-18 NOTE — Progress Notes (Signed)
Established patient visit   Patient: Rachael Gilmore   DOB: Sep 03, 1965   54 y.o. Female  MRN: [id number] Visit Date: 06/18/2020  Today's healthcare provider: Mila Merry, MD   Chief Complaint  Patient presents with  . Hyperlipidemia  . Hypertension   Subjective    HPI  Hypertension, follow-up  BP Readings from Last 3 Encounters:  06/18/20 114/69  03/13/19 (!) 142/92  11/06/17 116/64   Wt Readings from Last 3 Encounters:  06/18/20 221 lb (100.2 kg)  06/11/20 221 lb (100.2 kg)  03/13/19 226 lb (102.5 kg)     She was last seen for hypertension 1 year ago.  BP at that visit was 142/92. Management since that visit includes restarting medications (patient had been out of medications).  She reports good compliance with treatment. She is not having side effects.  She is following a Regular diet. She is exercising. She does not smoke.  Use of agents associated with hypertension: NSAIDS.   Outside blood pressures are 124/72. Symptoms: No chest pain No chest pressure  No palpitations No syncope  No dyspnea No orthopnea  No paroxysmal nocturnal dyspnea No lower extremity edema   Pertinent labs: Lab Results  Component Value Date   CHOL 164 09/24/2016   HDL 41 09/24/2016   LDLCALC 101 (H) 09/24/2016   TRIG 111 09/24/2016   CHOLHDL 4.0 09/24/2016   Lab Results  Component Value Date   NA 136 11/05/2017   K 3.4 (L) 11/05/2017   CREATININE 0.69 11/05/2017   GFRNONAA >60 11/05/2017   GFRAA >60 11/05/2017   GLUCOSE 125 (H) 11/05/2017     The ASCVD Risk score (Goff DC Jr., et al., 2013) failed to calculate for the following reasons:   The patient has a prior MI or stroke diagnosis   ---------------------------------------------------------------------------------------------------  Lipid/Cholesterol, Follow-up  Last lipid panel Other pertinent labs  Lab Results  Component Value Date   CHOL 164 09/24/2016   HDL 41 09/24/2016   LDLCALC 101 (H) 09/24/2016    TRIG 111 09/24/2016   CHOLHDL 4.0 09/24/2016   Lab Results  Component Value Date   ALT 14 11/05/2017   AST 22 11/05/2017   PLT 200 11/05/2017   TSH 1.870 11/11/2015     She was last seen for this 1 year ago.  Management since that visit includes restarting Pravastatin 40mg  daily.  She reports good compliance with treatment. She is not having side effects.   Symptoms: No chest pain No chest pressure/discomfort  No dyspnea No lower extremity edema  No numbness or tingling of extremity No orthopnea  No palpitations No paroxysmal nocturnal dyspnea  No speech difficulty No syncope   Current diet: well balanced Current exercise: boot camp  The ASCVD Risk score DC Jr., et al., 2013) failed to calculate for the following reasons:   The patient has a prior MI or stroke diagnosis  ---------------------------------------------------------------------------------------------------  Follow up for Vitamin D Deficiency:  The patient was last seen for this 1 year ago. Changes made at last visit include none. Lab Results  Component Value Date   VD25OH 45.6 06/18/2020    She reports good compliance with treatment. She feels that condition is Unchanged. She is not having side effects.   -----------------------------------------------------------------------------------------      Medications: Outpatient Medications Prior to Visit  Medication Sig  . albuterol (VENTOLIN HFA) 108 (90 Base) MCG/ACT inhaler Inhale 1-2 puffs into the lungs every 6 (six) hours as needed for shortness of breath.  13/05/2020  ALPRAZolam (XANAX) 0.5 MG tablet TAKE 1/2 TO 1 (ONE-HALF TO ONE) TABLET BY MOUTH TWICE DAILY AS NEEDED  . aspirin 325 MG tablet Take 1 tablet by mouth daily.  . Cholecalciferol (VITAMIN D-3) 25 MCG (1000 UT) CAPS Take 2 capsules by mouth daily.  . Coenzyme Q10 (CO Q 10) 100 MG CAPS Take 1 capsule by mouth daily.  . Multiple Vitamin (MULTI-VITAMINS) TABS Take 1 tablet by mouth daily.   Marland Kitchen nystatin ointment (MYCOSTATIN) Apply affected area twice daily as needed  . pravastatin (PRAVACHOL) 40 MG tablet TAKE 1 TABLET BY MOUTH ONCE DAILY (SCHEDULE  FOLLOW  UP)  . triamterene-hydrochlorothiazide (MAXZIDE-25) 37.5-25 MG tablet Take 1 tablet by mouth once daily  . [DISCONTINUED] guaiFENesin-codeine (ROBITUSSIN AC) 100-10 MG/5ML syrup Take 5 mLs by mouth at bedtime as needed for cough. (Patient not taking: Reported on 06/18/2020)  . [DISCONTINUED] predniSONE (STERAPRED UNI-PAK 21 TAB) 10 MG (21) TBPK tablet PO: Take 6 tablets on day 1:Take 5 tablets day 2:Take 4 tablets day 3: Take 3 tablets day 4:Take 2 tablets day five: 5 Take 1 tablet day 6 (Patient not taking: Reported on 06/18/2020)   No facility-administered medications prior to visit.    Review of Systems  Constitutional: Negative for appetite change, chills, fatigue and fever.  Respiratory: Negative for chest tightness and shortness of breath.   Cardiovascular: Negative for chest pain and palpitations.  Gastrointestinal: Negative for abdominal pain, nausea and vomiting.  Neurological: Negative for dizziness and weakness.      Objective    BP 114/69 (BP Location: Right Arm, Patient Position: Sitting, Cuff Size: Large)   Pulse 64   Temp 97.9 F (36.6 C) (Oral)   Resp 18   Wt 221 lb (100.2 kg)   BMI 39.15 kg/m    Physical Exam   General: Appearance:    Mildly obese female in no acute distress  Eyes:    PERRL, conjunctiva/corneas clear, EOM's intact       Lungs:     Clear to auscultation bilaterally, respirations unlabored  Heart:    Normal heart rate. Normal rhythm. No murmurs, rubs, or gallops.   MS:   All extremities are intact.   Neurologic:   Awake, alert, oriented x 3. No apparent focal neurological           defect.         Assessment & Plan     1. Essential hypertension Well controlled.  Continue current medications.    2. OSA (obstructive sleep apnea) Well controlled with weight loss, no longer  requires CPAP.   3. Hyperparathyroidism (HCC) Due to check - PTH, Intact and Calcium  4. Vitamin D deficiency - VITAMIN D 25 Hydroxy (Vit-D Deficiency, Fractures)  5. . Mixed hyperlipidemia She is tolerating pravastatin well with no adverse effects.   - CBC - Comprehensive metabolic panel - Lipid panel  7. Disease of thyroid gland Due to check- TSH  8. History of CVA (cerebrovascular accident) On statin and BP well controlled.   9. Colon cancer screening  - Cologuard  10. Need for hepatitis C screening test  - Hepatitis C antibody  11. History of COVID-19 Encouraged to get vaccine 3-6 months after recovering from covid  She declined flu vaccine       The entirety of the information documented in the History of Present Illness, Review of Systems and Physical Exam were personally obtained by me. Portions of this information were initially documented by the CMA and reviewed by me  for thoroughness and accuracy.      Lelon Huh, MD  [facility] [phone] (phone) [phone] (fax)  [place]

## 2020-06-19 ENCOUNTER — Encounter: Payer: Self-pay | Admitting: Family Medicine

## 2020-06-19 LAB — COMPREHENSIVE METABOLIC PANEL
ALT: 13 IU/L (ref 0–32)
AST: 12 IU/L (ref 0–40)
Albumin/Globulin Ratio: 2 (ref 1.2–2.2)
Albumin: 4.8 g/dL (ref 3.8–4.9)
Alkaline Phosphatase: 90 IU/L (ref 44–121)
BUN/Creatinine Ratio: 16 (ref 9–23)
BUN: 13 mg/dL (ref 6–24)
Bilirubin Total: 0.4 mg/dL (ref 0.0–1.2)
CO2: 27 mmol/L (ref 20–29)
Calcium: 9.7 mg/dL (ref 8.7–10.2)
Chloride: 95 mmol/L — ABNORMAL LOW (ref 96–106)
Creatinine, Ser: 0.79 mg/dL (ref 0.57–1.00)
GFR calc Af Amer: 97 mL/min/{1.73_m2} (ref >59)
GFR calc non Af Amer: 84 mL/min/{1.73_m2} (ref >59)
Globulin, Total: 2.4 g/dL (ref 1.5–4.5)
Glucose: 103 mg/dL — ABNORMAL HIGH (ref 65–99)
Potassium: 4.2 mmol/L (ref 3.5–5.2)
Sodium: 137 mmol/L (ref 134–144)
Total Protein: 7.2 g/dL (ref 6.0–8.5)

## 2020-06-19 LAB — LIPID PANEL
Chol/HDL Ratio: 3.6 ratio (ref 0.0–4.4)
Cholesterol, Total: 221 mg/dL — ABNORMAL HIGH (ref 100–199)
HDL: 61 mg/dL (ref >39)
LDL Chol Calc (NIH): 127 mg/dL — ABNORMAL HIGH (ref 0–99)
Triglycerides: 190 mg/dL — ABNORMAL HIGH (ref 0–149)
VLDL Cholesterol Cal: 33 mg/dL (ref 5–40)

## 2020-06-19 LAB — CBC
Hematocrit: 45.1 % (ref 34.0–46.6)
Hemoglobin: 15.3 g/dL (ref 11.1–15.9)
MCH: 29.6 pg (ref 26.6–33.0)
MCHC: 33.9 g/dL (ref 31.5–35.7)
MCV: 87 fL (ref 79–97)
Platelets: 268 10*3/uL (ref 150–450)
RBC: 5.17 x10E6/uL (ref 3.77–5.28)
RDW: 12.6 % (ref 11.7–15.4)
WBC: 7.2 10*3/uL (ref 3.4–10.8)

## 2020-06-19 LAB — VITAMIN D 25 HYDROXY (VIT D DEFICIENCY, FRACTURES): Vit D, 25-Hydroxy: 45.6 ng/mL (ref 30.0–100.0)

## 2020-06-19 LAB — TSH: TSH: 1.67 u[IU]/mL (ref 0.450–4.500)

## 2020-06-19 LAB — HEPATITIS C ANTIBODY: Hep C Virus Ab: <0.1 s/co ratio (ref 0.0–0.9)

## 2020-06-19 LAB — PTH, INTACT AND CALCIUM: PTH: 44 pg/mL (ref 15–65)

## 2020-07-01 ENCOUNTER — Other Ambulatory Visit: Payer: Self-pay | Admitting: Family Medicine

## 2020-07-01 DIAGNOSIS — Z1231 Encounter for screening mammogram for malignant neoplasm of breast: Secondary | ICD-10-CM

## 2020-08-17 ENCOUNTER — Other Ambulatory Visit: Payer: Self-pay | Admitting: Family Medicine

## 2020-08-17 DIAGNOSIS — E785 Hyperlipidemia, unspecified: Secondary | ICD-10-CM

## 2020-08-17 DIAGNOSIS — F419 Anxiety disorder, unspecified: Secondary | ICD-10-CM

## 2020-08-17 DIAGNOSIS — I779 Disorder of arteries and arterioles, unspecified: Secondary | ICD-10-CM

## 2020-08-17 DIAGNOSIS — I10 Essential (primary) hypertension: Secondary | ICD-10-CM

## 2020-08-18 NOTE — Telephone Encounter (Signed)
Requested medication (s) are due for refill today: no  Requested medication (s) are on the active medication list: yes  Last refill:  06/03/2020  Future visit scheduled: no  Notes to clinic:  this refill cannot be delegated   Requested Prescriptions  Pending Prescriptions Disp Refills   ALPRAZolam (XANAX) 0.5 MG tablet [Pharmacy Med Name: ALPRAZolam 0.5 MG Oral Tablet] 60 tablet 0    Sig: TAKE 1/2 TO 1 (ONE-HALF TO ONE) TABLET BY MOUTH TWICE DAILY AS NEEDED PATIENT  NEEDS  TO  SCHEDULE  OFFICE  VISIT  FOR  FOLLOW  UP      Not Delegated - Psychiatry:  Anxiolytics/Hypnotics Failed - 08/17/2020  2:40 PM      Failed - This refill cannot be delegated      Failed - Urine Drug Screen completed in last 360 days      Passed - Valid encounter within last 6 months    Recent Outpatient Visits           2 months ago Essential hypertension   [facility] Malva Limes, MD   2 months ago Cough   [facility], Eula Fried, FNP   1 year ago COVID-19 virus infection   [facility], Demetrios Isaacs, MD   1 year ago Hyperlipidemia, unspecified hyperlipidemia type   [facility] Malva Limes, MD   3 years ago Annual physical exam   [facility] Malva Limes, MD                 Signed Prescriptions Disp Refills   pravastatin (PRAVACHOL) 40 MG tablet 90 tablet 0    Sig: Take 1 tablet by mouth once daily      Cardiovascular:  Antilipid - Statins Failed - 08/17/2020  2:40 PM      Failed - Total Cholesterol in normal range and within 360 days    Cholesterol, Total  Date Value Ref Range Status  06/18/2020 221 (H) 100 - 199 mg/dL Final          Failed - LDL in normal range and within 360 days    LDL Chol Calc (NIH)  Date Value Ref Range Status  06/18/2020 127 (H) 0 - 99 mg/dL Final          Failed - Triglycerides in normal range and within 360 days    Triglycerides  Date Value Ref Range Status   06/18/2020 190 (H) 0 - 149 mg/dL Final          Passed - HDL in normal range and within 360 days    HDL  Date Value Ref Range Status  06/18/2020 61 >39 mg/dL Final          Passed - Patient is not pregnant      Passed - Valid encounter within last 12 months    Recent Outpatient Visits           2 months ago Essential hypertension   [facility] Malva Limes, MD   2 months ago Cough   [facility]um, Eula Fried, FNP   1 year ago COVID-19 virus infection   [facility] Malva Limes, MD   1 year ago Hyperlipidemia, unspecified hyperlipidemia type   [facility] Malva Limes, MD   3 years ago Annual physical exam   [facility] Malva Limes, MD  triamterene-hydrochlorothiazide (MAXZIDE-25) 37.5-25 MG tablet 90 tablet 0    Sig: Take 1 tablet by mouth once daily      Cardiovascular: Diuretic Combos Passed - 08/17/2020  2:40 PM      Passed - K in normal range and within 360 days    Potassium  Date Value Ref Range Status  06/18/2020 4.2 3.5 - 5.2 mmol/L Final          Passed - Na in normal range and within 360 days    Sodium  Date Value Ref Range Status  06/18/2020 137 134 - 144 mmol/L Final          Passed - Cr in normal range and within 360 days    Creatinine, Ser  Date Value Ref Range Status  06/18/2020 0.79 0.57 - 1.00 mg/dL Final          Passed - Ca in normal range and within 360 days    Calcium  Date Value Ref Range Status  06/18/2020 9.7 8.7 - 10.2 mg/dL Final          Passed - Last BP in normal range    BP Readings from Last 1 Encounters:  06/18/20 114/69          Passed - Valid encounter within last 6 months    Recent Outpatient Visits           2 months ago Essential hypertension   [facility] Malva Limes, MD   2 months ago Cough   [facility] Fl[facility]um, Eula Fried, FNP   1 year ago COVID-19  virus infection   [facility] Malva Limes, MD   1 year ago Hyperlipidemia, unspecified hyperlipidemia type   [facility] Malva Limes, MD   3 years ago Annual physical exam   [facility] Malva Limes, MD

## 2020-09-03 DIAGNOSIS — Z1211 Encounter for screening for malignant neoplasm of colon: Secondary | ICD-10-CM | POA: Diagnosis not present

## 2020-09-12 LAB — COLOGUARD
COLOGUARD: NEGATIVE
Cologuard: NEGATIVE

## 2020-09-12 LAB — EXTERNAL GENERIC LAB PROCEDURE: COLOGUARD: NEGATIVE

## 2020-09-15 ENCOUNTER — Telehealth: Payer: Self-pay

## 2020-09-15 NOTE — Telephone Encounter (Signed)
Tried calling patient to advise her that her Cologuard test result was negative. Left message to call back. OK for [facility] triage to advise of result.

## 2020-09-16 ENCOUNTER — Ambulatory Visit: Payer: Self-pay | Admitting: *Deleted

## 2020-09-16 NOTE — Telephone Encounter (Signed)
I read the message from Dr. Sherrie Mustache dated 09/16/2020 at 6:49 AM to her letting her know the Cologuard test result was negative. She was happy with the results and thanked me for my help.  I routed this note to [facility] so Dr. Sherrie Mustache would know she got the message.  Reason for Disposition . [1] Follow-up call to recent contact AND [2] information only call, no triage required  Answer Assessment - Initial Assessment Questions 1. REASON FOR CALL or QUESTION: "What is your reason for calling today?" or "How can I best help you?" or "What question do you have that I can help answer?"     Calling in for Cologuard test result.  Protocols used: INFORMATION ONLY CALL - NO TRIAGE-A-AH

## 2020-09-16 NOTE — Telephone Encounter (Signed)
Patient advised.

## 2020-09-16 NOTE — Telephone Encounter (Signed)
Attempted to contact pt; left message on voicemail. 

## 2020-09-24 ENCOUNTER — Telehealth: Payer: Self-pay | Admitting: Family Medicine

## 2020-09-24 DIAGNOSIS — I779 Disorder of arteries and arterioles, unspecified: Secondary | ICD-10-CM

## 2020-09-24 DIAGNOSIS — E785 Hyperlipidemia, unspecified: Secondary | ICD-10-CM

## 2020-09-24 NOTE — Telephone Encounter (Signed)
Please advise is time to check cholesterol since she has occlusion of carotid artery. Need ldl to get below 100. Please print and leave order for her

## 2020-10-17 NOTE — Telephone Encounter (Signed)
Patient advised. She plans to have labs done next week.

## 2020-12-20 ENCOUNTER — Other Ambulatory Visit: Payer: Self-pay | Admitting: Family Medicine

## 2020-12-20 DIAGNOSIS — I10 Essential (primary) hypertension: Secondary | ICD-10-CM

## 2021-01-04 ENCOUNTER — Telehealth: Payer: BC Managed Care – PPO | Admitting: Nurse Practitioner

## 2021-01-04 DIAGNOSIS — R059 Cough, unspecified: Secondary | ICD-10-CM | POA: Diagnosis not present

## 2021-01-04 DIAGNOSIS — J069 Acute upper respiratory infection, unspecified: Secondary | ICD-10-CM | POA: Diagnosis not present

## 2021-01-04 MED ORDER — AZITHROMYCIN 250 MG PO TABS: ORAL_TABLET | ORAL | 0 refills | Status: AC

## 2021-01-04 MED ORDER — PREDNISONE 20 MG PO TABS: 40.0000 mg | ORAL_TABLET | Freq: Every day | ORAL | 0 refills | Status: AC

## 2021-01-04 NOTE — Progress Notes (Signed)
We are sorry that you are not feeling well.  Here is how we plan to help!  Based on your presentation I believe you most likely have A cough due to bacteria.  When patients have a fever and a productive cough with a change in color or increased sputum production, we are concerned about bacterial bronchitis.  If left untreated it can progress to pneumonia.  If your symptoms do not improve with your treatment plan it is important that you contact your provider.   I have prescribed Azithromyin 250 mg: two tablets now and then one tablet daily for 4 additonal days   Prednisone 40mg  daily for 5 days.  From your responses in the eVisit questionnaire you describe inflammation in the upper respiratory tract which is causing a significant cough.  This is commonly called Bronchitis and has four common causes:    Allergies  Viral Infections  Acid Reflux  Bacterial Infection Allergies, viruses and acid reflux are treated by controlling symptoms or eliminating the cause. An example might be a cough caused by taking certain blood pressure medications. You stop the cough by changing the medication. Another example might be a cough caused by acid reflux. Controlling the reflux helps control the cough.  USE OF BRONCHODILATOR ("RESCUE") INHALERS: There is a risk from using your bronchodilator too frequently.  The risk is that over-reliance on a medication which only relaxes the muscles surrounding the breathing tubes can reduce the effectiveness of medications prescribed to reduce swelling and congestion of the tubes themselves.  Although you feel brief relief from the bronchodilator inhaler, your asthma may actually be worsening with the tubes becoming more swollen and filled with mucus.  This can delay other crucial treatments, such as oral steroid medications. If you need to use a bronchodilator inhaler daily, several times per day, you should discuss this with your provider.  There are probably better treatments  that could be used to keep your asthma under control.     HOME CARE . Only take medications as instructed by your medical team. . Complete the entire course of an antibiotic. . Drink plenty of fluids and get plenty of rest. . Avoid close contacts especially the very young and the elderly . Cover your mouth if you cough or cough into your sleeve. . Always remember to wash your hands . A steam or ultrasonic humidifier can help congestion.   GET HELP RIGHT AWAY IF: . You develop worsening fever. . You become short of breath . You cough up blood. . Your symptoms persist after you have completed your treatment plan MAKE SURE YOU   Understand these instructions.  Will watch your condition.  Will get help right away if you are not doing well or get worse.  Your e-visit answers were reviewed by a board certified advanced clinical practitioner to complete your personal care plan.  Depending on the condition, your plan could have included both over the counter or prescription medications. If there is a problem please reply  once you have received a response from your provider. Your safety is important to .  If you have drug allergies check your prescription carefully.    You can use MyChart to ask questions about today's visit, request a non-urgent call back, or ask for a work or school excuse for 24 hours related to this e-Visit. If it has been greater than 24 hours you will need to follow up with your provider, or enter a new e-Visit to address those concerns.  You will get an e-mail in the next two days asking about your experience.  I hope that your e-visit has been valuable and will speed your recovery. Thank you for using e-visits.  I have spent at least 5 minutes reviewing and documenting in the patient's chart.

## 2021-01-19 ENCOUNTER — Other Ambulatory Visit: Payer: Self-pay | Admitting: Family Medicine

## 2021-01-19 DIAGNOSIS — F419 Anxiety disorder, unspecified: Secondary | ICD-10-CM

## 2021-01-29 ENCOUNTER — Telehealth: Payer: BC Managed Care – PPO | Admitting: Physician Assistant

## 2021-01-29 DIAGNOSIS — J208 Acute bronchitis due to other specified organisms: Secondary | ICD-10-CM | POA: Diagnosis not present

## 2021-01-29 DIAGNOSIS — B9689 Other specified bacterial agents as the cause of diseases classified elsewhere: Secondary | ICD-10-CM | POA: Diagnosis not present

## 2021-01-30 MED ORDER — AZITHROMYCIN 250 MG PO TABS: ORAL_TABLET | ORAL | 0 refills | Status: DC

## 2021-01-30 MED ORDER — PREDNISONE 10 MG (21) PO TBPK: ORAL_TABLET | ORAL | 0 refills | Status: DC

## 2021-01-30 NOTE — Progress Notes (Signed)
We are sorry that you are not feeling well.  Here is how we plan to help!  Based on your presentation I believe you most likely have A cough due to bacteria.  When patients have a fever and a productive cough with a change in color or increased sputum production, we are concerned about bacterial bronchitis.  If left untreated it can progress to pneumonia.  If your symptoms do not improve with your treatment plan it is important that you contact your provider.   I have prescribed Azithromyin 250 mg: two tablets now and then one tablet daily for 4 additonal days    In addition you may use  Prednisone 10 mg daily for 6 days (see taper instructions below)  Directions for 6 day taper: Day 1: 2 tablets before breakfast, 1 after both lunch & dinner and 2 at bedtime Day 2: 1 tab before breakfast, 1 after both lunch & dinner and 2 at bedtime Day 3: 1 tab at each meal & 1 at bedtime Day 4: 1 tab at breakfast, 1 at lunch, 1 at bedtime Day 5: 1 tab at breakfast & 1 tab at bedtime Day 6: 1 tab at breakfast  From your responses in the eVisit questionnaire you describe inflammation in the upper respiratory tract which is causing a significant cough.  This is commonly called Bronchitis and has four common causes:   Allergies Viral Infections Acid Reflux Bacterial Infection Allergies, viruses and acid reflux are treated by controlling symptoms or eliminating the cause. An example might be a cough caused by taking certain blood pressure medications. You stop the cough by changing the medication. Another example might be a cough caused by acid reflux. Controlling the reflux helps control the cough.  USE OF BRONCHODILATOR ("RESCUE") INHALERS: There is a risk from using your bronchodilator too frequently.  The risk is that over-reliance on a medication which only relaxes the muscles surrounding the breathing tubes can reduce the effectiveness of medications prescribed to reduce swelling and congestion of the tubes  themselves.  Although you feel brief relief from the bronchodilator inhaler, your asthma may actually be worsening with the tubes becoming more swollen and filled with mucus.  This can delay other crucial treatments, such as oral steroid medications. If you need to use a bronchodilator inhaler daily, several times per day, you should discuss this with your provider.  There are probably better treatments that could be used to keep your asthma under control.     HOME CARE Only take medications as instructed by your medical team. Complete the entire course of an antibiotic. Drink plenty of fluids and get plenty of rest. Avoid close contacts especially the very young and the elderly Cover your mouth if you cough or cough into your sleeve. Always remember to wash your hands A steam or ultrasonic humidifier can help congestion.   GET HELP RIGHT AWAY IF: You develop worsening fever. You become short of breath You cough up blood. Your symptoms persist after you have completed your treatment plan MAKE SURE YOU  Understand these instructions. Will watch your condition. Will get help right away if you are not doing well or get worse.    Thank you for choosing an e-visit.  Your e-visit answers were reviewed by a board certified advanced clinical practitioner to complete your personal care plan. Depending upon the condition, your plan could have included both over the counter or prescription medications.  Please review your pharmacy choice. Make sure the pharmacy is open  so you can pick up prescription now. If there is a problem, you may contact your provider through MyChart messaging and have the prescription routed to another pharmacy.  Your safety is important to us. If you have drug allergies check your prescription carefully.   For the next 24 hours you can use MyChart to ask questions about today's visit, request a non-urgent call back, or ask for a work or school excuse. You will get an  email in the next two days asking about your experience. I hope that your e-visit has been valuable and will speed your recovery.  I provided 5 minutes of non face-to-face time during this encounter for chart review and documentation.   

## 2021-02-18 ENCOUNTER — Other Ambulatory Visit: Payer: Self-pay | Admitting: Family Medicine

## 2021-02-18 DIAGNOSIS — F419 Anxiety disorder, unspecified: Secondary | ICD-10-CM

## 2021-03-13 ENCOUNTER — Other Ambulatory Visit: Payer: Self-pay

## 2021-03-13 DIAGNOSIS — E785 Hyperlipidemia, unspecified: Secondary | ICD-10-CM

## 2021-03-13 DIAGNOSIS — F419 Anxiety disorder, unspecified: Secondary | ICD-10-CM

## 2021-03-13 DIAGNOSIS — I10 Essential (primary) hypertension: Secondary | ICD-10-CM

## 2021-03-13 DIAGNOSIS — I779 Disorder of arteries and arterioles, unspecified: Secondary | ICD-10-CM

## 2021-03-13 MED ORDER — PRAVASTATIN SODIUM 40 MG PO TABS: 40.0000 mg | ORAL_TABLET | Freq: Every day | ORAL | 0 refills | Status: DC

## 2021-03-13 MED ORDER — ALPRAZOLAM 0.5 MG PO TABS: ORAL_TABLET | ORAL | 0 refills | Status: DC

## 2021-03-13 MED ORDER — TRIAMTERENE-HCTZ 37.5-25 MG PO TABS: 1.0000 | ORAL_TABLET | Freq: Every day | ORAL | 0 refills | Status: DC

## 2021-03-13 NOTE — Telephone Encounter (Signed)
Please review. Next OV is scheduled on 04/14/2021. Thanks!

## 2021-03-13 NOTE — Telephone Encounter (Signed)
Copied from CRM [id number]. Topic: General - Other >> Mar 12, 2021  4:20 PM Glean Salen wrote: Reason for CRM: patient called in requests, short supply of triamterene-hydrochlorothiazide (MAXZIDE-25) 37.5-25 MG tablet, ALPRAZolam (XANAX) 0.5 MG tablet and pravastatin (PRAVACHOL) 40 MG tablet until her appt on 09/06

## 2021-03-23 ENCOUNTER — Other Ambulatory Visit: Payer: Self-pay

## 2021-03-23 ENCOUNTER — Ambulatory Visit (INDEPENDENT_AMBULATORY_CARE_PROVIDER_SITE_OTHER): Payer: BC Managed Care – PPO | Admitting: Family Medicine

## 2021-03-23 ENCOUNTER — Encounter: Payer: Self-pay | Admitting: Family Medicine

## 2021-03-23 VITALS — BP 136/75 | HR 63 | Temp 98.7°F | Resp 16 | Ht 63.0 in | Wt 225.0 lb

## 2021-03-23 DIAGNOSIS — E785 Hyperlipidemia, unspecified: Secondary | ICD-10-CM | POA: Diagnosis not present

## 2021-03-23 DIAGNOSIS — F419 Anxiety disorder, unspecified: Secondary | ICD-10-CM

## 2021-03-23 DIAGNOSIS — I10 Essential (primary) hypertension: Secondary | ICD-10-CM | POA: Diagnosis not present

## 2021-03-23 DIAGNOSIS — E559 Vitamin D deficiency, unspecified: Secondary | ICD-10-CM

## 2021-03-23 DIAGNOSIS — I779 Disorder of arteries and arterioles, unspecified: Secondary | ICD-10-CM | POA: Diagnosis not present

## 2021-03-23 MED ORDER — ALPRAZOLAM 0.5 MG PO TABS: ORAL_TABLET | ORAL | 3 refills | Status: DC

## 2021-03-23 MED ORDER — ALBUTEROL SULFATE HFA 108 (90 BASE) MCG/ACT IN AERS: 1.0000 | INHALATION_SPRAY | Freq: Four times a day (QID) | RESPIRATORY_TRACT | 1 refills | Status: DC | PRN

## 2021-03-23 NOTE — Progress Notes (Signed)
Established patient visit   Patient: Rachael Gilmore   DOB: March 06, 1966   55 y.o. Female  MRN: [id number] Visit Date: 03/23/2021  Today's healthcare provider: Mila Merry, MD   Chief Complaint  Patient presents with   Hyperlipidemia   Hypertension   Subjective   HPI  Hypertension, follow-up  BP Readings from Last 3 Encounters:  03/23/21 136/75  06/18/20 114/69  03/13/19 (!) 142/92   Wt Readings from Last 3 Encounters:  03/23/21 225 lb (102.1 kg)  06/18/20 221 lb (100.2 kg)  06/11/20 221 lb (100.2 kg)     She was last seen for hypertension 10 months ago.  BP at that visit was 114/69. Management since that visit includes no medication changes.  She reports good compliance with treatment. She is not having side effects.  She is following a Regular diet. She is not exercising. She does not smoke.  Use of agents associated with hypertension: none.   Outside blood pressures are checked occasionally. Symptoms: No chest pain No chest pressure  No palpitations No syncope  No dyspnea No orthopnea  No paroxysmal nocturnal dyspnea No lower extremity edema   Pertinent labs: Lab Results  Component Value Date   CHOL 221 (H) 06/18/2020   HDL 61 06/18/2020   LDLCALC 127 (H) 06/18/2020   TRIG 190 (H) 06/18/2020   CHOLHDL 3.6 06/18/2020   Lab Results  Component Value Date   NA 137 06/18/2020   K 4.2 06/18/2020   CREATININE 0.79 06/18/2020   GFRNONAA 84 06/18/2020   GFRAA 97 06/18/2020   GLUCOSE 103 (H) 06/18/2020     The 10-year ASCVD risk score Denman George DC Jr., et al., 2013) is: 3.7%   Lipid/Cholesterol, Follow-up   She was last seen for this 10 months ago.  Management since that visit includes no medication changes.  She reports good compliance with treatment. She is not having side effects.   Symptoms: No chest pain No chest pressure/discomfort  No dyspnea No lower extremity edema  No numbness or tingling of extremity No orthopnea  No palpitations  No paroxysmal nocturnal dyspnea  No speech difficulty No syncope      Medications: Outpatient Medications Prior to Visit  Medication Sig   albuterol (VENTOLIN HFA) 108 (90 Base) MCG/ACT inhaler Inhale 1-2 puffs into the lungs every 6 (six) hours as needed for shortness of breath.   ALPRAZolam (XANAX) 0.5 MG tablet TAKE 1/2 TO 1 (ONE-HALF TO ONE) TABLET BY MOUTH TWICE DAILY AS NEEDED (NEEDS  TO  SCHEDULE  FOLLOW  UP  VISIT)   aspirin 325 MG tablet Take 1 tablet by mouth daily.   Cholecalciferol (VITAMIN D-3) 25 MCG (1000 UT) CAPS Take 2 capsules by mouth daily.   Coenzyme Q10 (CO Q 10) 100 MG CAPS Take 1 capsule by mouth daily.   Multiple Vitamin (MULTI-VITAMINS) TABS Take 1 tablet by mouth daily.   nystatin ointment (MYCOSTATIN) Apply affected area twice daily as needed   pravastatin (PRAVACHOL) 40 MG tablet Take 1 tablet (40 mg total) by mouth daily.   triamterene-hydrochlorothiazide (MAXZIDE-25) 37.5-25 MG tablet Take 1 tablet by mouth daily.   [DISCONTINUED] azithromycin (ZITHROMAX) 250 MG tablet Take 2 tablets PO on day one, and one tablet PO daily thereafter until completed.   [DISCONTINUED] predniSONE (STERAPRED UNI-PAK 21 TAB) 10 MG (21) TBPK tablet 6 day taper; take as directed on package instructions   No facility-administered medications prior to visit.    Review of Systems  Constitutional: Negative.  Respiratory: Negative.    Cardiovascular: Negative.   Endocrine: Negative.       Objective  -------------------------------------------------------------------------------------------------------------------- BP 136/75   Pulse 63   Temp 98.7 F (37.1 C)   Resp 16   Ht 5\' 3"  (1.6 m)   Wt 225 lb (102.1 kg)   BMI 39.86 kg/m     Physical Exam   General: Appearance:    Obese female in no acute distress  Eyes:    PERRL, conjunctiva/corneas clear, EOM's intact       Lungs:     Clear to auscultation bilaterally, respirations unlabored  Heart:    Normal heart rate.  Normal rhythm. No murmurs, rubs, or gallops.    MS:   All extremities are intact.    Neurologic:   Awake, alert, oriented x 3. No apparent focal neurological defect.         Assessment & Plan  ---------------------------------------------------------------------------------------------------------------------- 1. Vitamin D deficiency   2. Essential hypertension Well controlled.  Continue current medications.    3. Hyperlipidemia, unspecified hyperlipidemia type She is tolerating pravastatin well with no adverse effects.    4. Anxiety refill - ALPRAZolam (XANAX) 0.5 MG tablet; TAKE 1/2 TO 1 (ONE-HALF TO ONE) TABLET BY MOUTH TWICE DAILY AS NEEDED  Dispense: 60 tablet; Refill: 3      The entirety of the information documented in the History of Present Illness, Review of Systems and Physical Exam were personally obtained by me. Portions of this information were initially documented by the CMA and reviewed by me for thoroughness and accuracy.     , MD  [facility] [phone] (phone) [phone] (fax)  [facility]

## 2021-03-23 NOTE — Patient Instructions (Addendum)
Please call the [facility] at [phone] to schedule your mammogram.  Call back in October of November to schedule your physical with our new Physician Assistant

## 2021-03-24 LAB — LIPID PANEL
Chol/HDL Ratio: 3.6 ratio (ref 0.0–4.4)
Cholesterol, Total: 181 mg/dL (ref 100–199)
HDL: 50 mg/dL (ref >39)
LDL Chol Calc (NIH): 102 mg/dL — ABNORMAL HIGH (ref 0–99)
Triglycerides: 167 mg/dL — ABNORMAL HIGH (ref 0–149)
VLDL Cholesterol Cal: 29 mg/dL (ref 5–40)

## 2021-04-01 ENCOUNTER — Encounter: Payer: Self-pay | Admitting: Family Medicine

## 2021-04-02 ENCOUNTER — Telehealth: Payer: Self-pay

## 2021-04-02 NOTE — Telephone Encounter (Signed)
Copied from CRM [id number]. Topic: General - Inquiry >> Apr 02, 2021 12:28 PM Traci Sermon wrote: Reason for CRM: Pt called in about her labs, the ones that were flagged red, she asked if someone could give her a call to talk about them. Those are the CO2 and Alcumin/Globlin. Please advise. Okay to leave a message if she does not answer.

## 2021-04-02 NOTE — Telephone Encounter (Signed)
See MyChart message.  ° °Thanks,  ° °-Johnie Stadel  °

## 2021-04-12 DIAGNOSIS — M66821 Spontaneous rupture of other tendons, right upper arm: Secondary | ICD-10-CM | POA: Diagnosis not present

## 2021-04-14 ENCOUNTER — Ambulatory Visit: Payer: BC Managed Care – PPO | Admitting: Family Medicine

## 2021-04-16 DIAGNOSIS — S46111A Strain of muscle, fascia and tendon of long head of biceps, right arm, initial encounter: Secondary | ICD-10-CM | POA: Diagnosis not present

## 2021-04-16 LAB — COMPREHENSIVE METABOLIC PANEL
ALT: 11 IU/L (ref 0–32)
AST: 13 IU/L (ref 0–40)
Albumin/Globulin Ratio: 2.7 — ABNORMAL HIGH (ref 1.2–2.2)
Albumin: 4.8 g/dL (ref 3.8–4.9)
Alkaline Phosphatase: 79 IU/L (ref 44–121)
BUN/Creatinine Ratio: 20 (ref 9–23)
BUN: 14 mg/dL (ref 6–24)
Bilirubin Total: <0.2 mg/dL (ref 0.0–1.2)
CO2: 17 mmol/L — ABNORMAL LOW (ref 20–29)
Calcium: 9.5 mg/dL (ref 8.7–10.2)
Chloride: 100 mmol/L (ref 96–106)
Creatinine, Ser: 0.69 mg/dL (ref 0.57–1.00)
Globulin, Total: 1.8 g/dL (ref 1.5–4.5)
Glucose: 115 mg/dL — ABNORMAL HIGH (ref 65–99)
Potassium: 4 mmol/L (ref 3.5–5.2)
Sodium: 140 mmol/L (ref 134–144)
Total Protein: 6.6 g/dL (ref 6.0–8.5)
eGFR: 101 mL/min/{1.73_m2} (ref >59)

## 2021-04-16 LAB — SPECIMEN STATUS REPORT

## 2021-04-16 LAB — VITAMIN D 25 HYDROXY (VIT D DEFICIENCY, FRACTURES): Vit D, 25-Hydroxy: 97.3 ng/mL (ref 30.0–100.0)

## 2021-06-02 ENCOUNTER — Other Ambulatory Visit: Payer: Self-pay | Admitting: Family Medicine

## 2021-06-02 DIAGNOSIS — I10 Essential (primary) hypertension: Secondary | ICD-10-CM

## 2021-06-02 MED ORDER — TRIAMTERENE-HCTZ 37.5-25 MG PO TABS: 1.0000 | ORAL_TABLET | Freq: Every day | ORAL | 0 refills | Status: DC

## 2021-06-02 NOTE — Telephone Encounter (Signed)
Please review is it okay to fill this early?   Thanks,   -Vernona Rieger

## 2021-07-04 ENCOUNTER — Other Ambulatory Visit: Payer: Self-pay | Admitting: Family Medicine

## 2021-07-04 DIAGNOSIS — E785 Hyperlipidemia, unspecified: Secondary | ICD-10-CM

## 2021-07-04 DIAGNOSIS — I779 Disorder of arteries and arterioles, unspecified: Secondary | ICD-10-CM

## 2021-10-18 ENCOUNTER — Other Ambulatory Visit: Payer: Self-pay | Admitting: Family Medicine

## 2021-10-18 DIAGNOSIS — F419 Anxiety disorder, unspecified: Secondary | ICD-10-CM

## 2021-11-27 ENCOUNTER — Telehealth: Payer: BC Managed Care – PPO | Admitting: Physician Assistant

## 2021-11-27 DIAGNOSIS — J029 Acute pharyngitis, unspecified: Secondary | ICD-10-CM | POA: Diagnosis not present

## 2021-11-27 DIAGNOSIS — Z20818 Contact with and (suspected) exposure to other bacterial communicable diseases: Secondary | ICD-10-CM | POA: Diagnosis not present

## 2021-11-27 MED ORDER — AMOXICILLIN 500 MG PO TABS: 500.0000 mg | ORAL_TABLET | Freq: Two times a day (BID) | ORAL | 0 refills | Status: AC

## 2021-11-27 NOTE — Progress Notes (Signed)

## 2021-11-27 NOTE — Progress Notes (Signed)
I have spent 5 minutes in review of e-visit questionnaire, review and updating patient chart, medical decision making and response to patient.   Rushawn Capshaw Cody Eilan Mcinerny, PA-C    

## 2022-01-05 ENCOUNTER — Ambulatory Visit: Payer: Self-pay | Admitting: *Deleted

## 2022-01-05 ENCOUNTER — Encounter: Payer: Self-pay | Admitting: Family Medicine

## 2022-01-05 ENCOUNTER — Ambulatory Visit (INDEPENDENT_AMBULATORY_CARE_PROVIDER_SITE_OTHER): Payer: BC Managed Care – PPO | Admitting: Family Medicine

## 2022-01-05 VITALS — BP 114/76 | HR 67 | Temp 97.8°F | Resp 14 | Wt 227.0 lb

## 2022-01-05 DIAGNOSIS — M25571 Pain in right ankle and joints of right foot: Secondary | ICD-10-CM | POA: Diagnosis not present

## 2022-01-05 DIAGNOSIS — M25562 Pain in left knee: Secondary | ICD-10-CM | POA: Diagnosis not present

## 2022-01-05 MED ORDER — INDOMETHACIN 50 MG PO CAPS: 50.0000 mg | ORAL_CAPSULE | Freq: Three times a day (TID) | ORAL | 0 refills | Status: DC

## 2022-01-05 NOTE — Progress Notes (Signed)
Established patient visit  I,April Miller,acting as a scribe for Mila Merry, MD.,have documented all relevant documentation on the behalf of Mila Merry, MD,as directed by  Mila Merry, MD while in the presence of Mila Merry, MD.   Patient: Rachael Gilmore   DOB: [birth year]   56 y.o. Female  MRN: [id number] Visit Date: 01/05/2022  Today's healthcare provider: Mila Merry, MD   Chief Complaint  Patient presents with   Knee Pain   Subjective    Knee Pain  There was no injury mechanism. The pain is present in the left knee, right foot and left thigh. The quality of the pain is described as aching and cramping. The pain is at a severity of 9/10. The pain is severe. The pain has been Constant since onset. Associated symptoms include an inability to bear weight, a loss of motion, a loss of sensation, numbness and tingling. Pertinent negatives include no muscle weakness. She reports no foreign bodies present. The symptoms are aggravated by movement and weight bearing. She has tried rest, non-weight bearing, immobilization, elevation, heat and ice for the symptoms. The treatment provided no relief.    Patient has had left knee and right foot pain for around 1 month. Some swelling in knee and foot. Patient states pain is severe and constant. Patient is having numbness, tingling, and inability to bear weight. Patient has been treating pain with Ibuprofen, ice, heat, Icy Hot and rest with no relief.No fevers, chills or sweats.     Medications: Outpatient Medications Prior to Visit  Medication Sig   albuterol (VENTOLIN HFA) 108 (90 Base) MCG/ACT inhaler Inhale 1-2 puffs into the lungs every 6 (six) hours as needed for shortness of breath.   ALPRAZolam (XANAX) 0.5 MG tablet TAKE 1/2 TO 1 (ONE-HALF TO ONE) TABLET BY MOUTH TWICE DAILY AS NEEDED   aspirin 325 MG tablet Take 1 tablet by mouth daily.   Cholecalciferol (VITAMIN D-3) 25 MCG (1000 UT) CAPS Take 2 capsules by mouth daily.    Coenzyme Q10 (CO Q 10) 100 MG CAPS Take 1 capsule by mouth daily.   Multiple Vitamin (MULTI-VITAMINS) TABS Take 1 tablet by mouth daily.   nystatin ointment (MYCOSTATIN) Apply affected area twice daily as needed   pravastatin (PRAVACHOL) 40 MG tablet Take 1 tablet by mouth once daily   triamterene-hydrochlorothiazide (MAXZIDE-25) 37.5-25 MG tablet Take 1 tablet by mouth daily.   [DISCONTINUED] triamterene-hydrochlorothiazide (MAXZIDE-25) 37.5-25 MG tablet Take 1 tablet by mouth once daily   No facility-administered medications prior to visit.    Review of Systems  Constitutional:  Negative for appetite change, chills, fatigue and fever.  Respiratory:  Negative for chest tightness and shortness of breath.   Cardiovascular:  Negative for chest pain and palpitations.  Gastrointestinal:  Negative for abdominal pain, nausea and vomiting.  Neurological:  Positive for numbness. Negative for dizziness and weakness.      Objective    BP 114/76 (BP Location: Right Arm, Patient Position: Sitting, Cuff Size: Large)   Pulse 67   Temp 97.8 F (36.6 C) (Temporal)   Resp 14   Wt 227 lb (103 kg)   SpO2 98%   BMI 40.21 kg/m    Physical Exam  Moderate swelling, tenderness and slight erythema left lateral knee and right lateral ankle.     Assessment & Plan     1. Acute right ankle pain  - DG Knee Complete 4 Views Left; Future  2. Acute pain of left knee  -  DG Ankle Complete Right; Future  - indomethacin (INDOCIN) 50 MG capsule; Take 1 capsule (50 mg total) by mouth 3 (three) times daily with meals.  Dispense: 30 capsule; Refill: 0   Consider checking uric acid levels if imaging studies do not rule out gouty arthritis     The entirety of the information documented in the History of Present Illness, Review of Systems and Physical Exam were personally obtained by me. Portions of this information were initially documented by the CMA and reviewed by me for thoroughness and accuracy.      Mila Merry, MD  [facility] [phone] (phone) [phone] (fax)  [facility]

## 2022-01-05 NOTE — Telephone Encounter (Signed)
  Chief Complaint: knee and foot pain Symptoms: pain, swelling Frequency: comes and goes with medication schedule Pertinent Negatives: Patient denies fever, redness, warmth Disposition: [] ED /[] Urgent Care (no appt availability in office) / [x] Appointment(In office/virtual)/ []  [place] [facility]/ [] Home Care/ [] Refused Recommended Disposition /[] [place] [facility]/ []  Follow-up with PCP Additional Notes: Pt able to get appt this afternoon with PCP Dr. .  Reason for Disposition  [1] Swollen joint AND [2] no fever or redness  Answer Assessment - Initial Assessment Questions 1. LOCATION and RADIATION: "Where is the pain located?"     L  knee, R foot 2. QUALITY: "What does the pain feel like?"  (e.g., sharp, dull, aching, burning)     excruciating 3. SEVERITY: "How bad is the pain?" "What does it keep you from doing?"   (Scale 1-10; or mild, moderate, severe)   -  MILD (1-3): doesn't interfere with normal activities    -  MODERATE (4-7): interferes with normal activities (e.g., work or school) or awakens from sleep, limping    -  SEVERE (8-10): excruciating pain, unable to do any normal activities, unable to walk     Was severe, pain better but now tight and swollen 4. ONSET: "When did the pain start?" "Does it come and go, or is it there all the time?"     Started a month ago 5. RECURRENT: "Have you had this pain before?" If Yes, ask: "When, and what happened then?"     Had to come off aspirin for dental procedure 6. SETTING: "Has there been any recent work, exercise or other activity that involved that part of the body?"      no 7. AGGRAVATING FACTORS: "What makes the knee pain worse?" (e.g., walking, climbing stairs, running)     Knee and foot hurt equally 8. ASSOCIATED SYMPTOMS: "Is there any swelling or redness of the knee?"     Red at times, not right now 9. OTHER SYMPTOMS: "Do you have any other symptoms?" (e.g., chest pain, difficulty breathing, fever, calf pain)      Swelling, tender, no fever 10. PREGNANCY: "Is there any chance you are pregnant?" "When was your last menstrual period?"       na  Protocols used: Knee Pain-A-AH

## 2022-01-06 ENCOUNTER — Ambulatory Visit
Admission: RE | Admit: 2022-01-06 | Discharge: 2022-01-06 | Disposition: A | Discharge status: Home / Self Care | Payer: BC Managed Care – PPO | Attending: Family Medicine | Admitting: Family Medicine

## 2022-01-06 ENCOUNTER — Ambulatory Visit
Admission: RE | Admit: 2022-01-06 | Discharge: 2022-01-06 | Disposition: A | Discharge status: Home / Self Care | Payer: BC Managed Care – PPO | Source: Ambulatory Visit | Attending: Family Medicine | Admitting: Family Medicine

## 2022-01-06 DIAGNOSIS — M25571 Pain in right ankle and joints of right foot: Secondary | ICD-10-CM

## 2022-01-06 DIAGNOSIS — M25562 Pain in left knee: Secondary | ICD-10-CM

## 2022-01-08 ENCOUNTER — Other Ambulatory Visit: Payer: Self-pay

## 2022-01-08 DIAGNOSIS — M25562 Pain in left knee: Secondary | ICD-10-CM

## 2022-01-08 DIAGNOSIS — M25571 Pain in right ankle and joints of right foot: Secondary | ICD-10-CM

## 2022-01-10 DIAGNOSIS — G43809 Other migraine, not intractable, without status migrainosus: Secondary | ICD-10-CM | POA: Diagnosis not present

## 2022-01-10 DIAGNOSIS — M79652 Pain in left thigh: Secondary | ICD-10-CM | POA: Diagnosis not present

## 2022-01-10 DIAGNOSIS — I10 Essential (primary) hypertension: Secondary | ICD-10-CM | POA: Diagnosis not present

## 2022-01-12 ENCOUNTER — Encounter: Payer: Self-pay | Admitting: Family Medicine

## 2022-01-12 DIAGNOSIS — M25562 Pain in left knee: Secondary | ICD-10-CM | POA: Diagnosis not present

## 2022-01-12 DIAGNOSIS — M25571 Pain in right ankle and joints of right foot: Secondary | ICD-10-CM | POA: Diagnosis not present

## 2022-01-13 LAB — URIC ACID: Uric Acid: 6.2 mg/dL (ref 3.0–7.2)

## 2022-01-13 MED ORDER — PREDNISONE 10 MG PO TABS: ORAL_TABLET | ORAL | 0 refills | Status: AC

## 2022-01-13 NOTE — Progress Notes (Signed)
Have sent prescription for prednisone to [facility] and placed referral order to orthopedics.

## 2022-01-13 NOTE — Addendum Note (Signed)
Addended by: Birdie Sons on: 01/13/2022 03:18 PM   Modules accepted: Orders

## 2022-01-15 DIAGNOSIS — S86912A Strain of unspecified muscle(s) and tendon(s) at lower leg level, left leg, initial encounter: Secondary | ICD-10-CM | POA: Diagnosis not present

## 2022-01-15 DIAGNOSIS — S86919A Strain of unspecified muscle(s) and tendon(s) at lower leg level, unspecified leg, initial encounter: Secondary | ICD-10-CM | POA: Insufficient documentation

## 2022-01-15 DIAGNOSIS — S86911A Strain of unspecified muscle(s) and tendon(s) at lower leg level, right leg, initial encounter: Secondary | ICD-10-CM | POA: Diagnosis not present

## 2022-01-17 ENCOUNTER — Other Ambulatory Visit: Payer: Self-pay | Admitting: Family Medicine

## 2022-01-17 DIAGNOSIS — M25571 Pain in right ankle and joints of right foot: Secondary | ICD-10-CM

## 2022-01-17 MED ORDER — COLCHICINE 0.6 MG PO TABS: ORAL_TABLET | ORAL | 1 refills | Status: DC

## 2022-01-21 DIAGNOSIS — S86912A Strain of unspecified muscle(s) and tendon(s) at lower leg level, left leg, initial encounter: Secondary | ICD-10-CM | POA: Diagnosis not present

## 2022-01-24 ENCOUNTER — Telehealth: Payer: BC Managed Care – PPO | Admitting: Emergency Medicine

## 2022-01-24 ENCOUNTER — Encounter: Payer: Self-pay | Admitting: Emergency Medicine

## 2022-01-24 DIAGNOSIS — Z20818 Contact with and (suspected) exposure to other bacterial communicable diseases: Secondary | ICD-10-CM

## 2022-01-24 DIAGNOSIS — J029 Acute pharyngitis, unspecified: Secondary | ICD-10-CM | POA: Diagnosis not present

## 2022-01-24 MED ORDER — AMOXICILLIN 500 MG PO CAPS: 500.0000 mg | ORAL_CAPSULE | Freq: Two times a day (BID) | ORAL | 0 refills | Status: AC

## 2022-01-24 NOTE — Progress Notes (Signed)

## 2022-01-24 NOTE — Progress Notes (Signed)
I have spent 5 minutes in review of e-visit questionnaire, review and updating patient chart, medical decision making and response to patient.   Breianna Delfino, PA-C    

## 2022-03-23 DIAGNOSIS — M25511 Pain in right shoulder: Secondary | ICD-10-CM | POA: Diagnosis not present

## 2022-03-26 DIAGNOSIS — M25511 Pain in right shoulder: Secondary | ICD-10-CM | POA: Diagnosis not present

## 2022-04-14 DIAGNOSIS — M25571 Pain in right ankle and joints of right foot: Secondary | ICD-10-CM | POA: Diagnosis not present

## 2022-04-14 DIAGNOSIS — M25562 Pain in left knee: Secondary | ICD-10-CM | POA: Diagnosis not present

## 2022-04-16 DIAGNOSIS — M2550 Pain in unspecified joint: Secondary | ICD-10-CM | POA: Diagnosis not present

## 2022-04-18 ENCOUNTER — Other Ambulatory Visit: Payer: Self-pay | Admitting: Family Medicine

## 2022-04-18 DIAGNOSIS — F419 Anxiety disorder, unspecified: Secondary | ICD-10-CM

## 2022-04-20 DIAGNOSIS — R6889 Other general symptoms and signs: Secondary | ICD-10-CM | POA: Insufficient documentation

## 2022-05-03 ENCOUNTER — Telehealth: Payer: BC Managed Care – PPO | Admitting: Physician Assistant

## 2022-05-03 DIAGNOSIS — M109 Gout, unspecified: Secondary | ICD-10-CM | POA: Diagnosis not present

## 2022-05-03 DIAGNOSIS — M25562 Pain in left knee: Secondary | ICD-10-CM | POA: Diagnosis not present

## 2022-05-03 DIAGNOSIS — M10071 Idiopathic gout, right ankle and foot: Secondary | ICD-10-CM | POA: Diagnosis not present

## 2022-05-03 DIAGNOSIS — M2550 Pain in unspecified joint: Secondary | ICD-10-CM | POA: Diagnosis not present

## 2022-05-03 DIAGNOSIS — M10062 Idiopathic gout, left knee: Secondary | ICD-10-CM | POA: Diagnosis not present

## 2022-05-03 DIAGNOSIS — Z20818 Contact with and (suspected) exposure to other bacterial communicable diseases: Secondary | ICD-10-CM | POA: Diagnosis not present

## 2022-05-03 MED ORDER — PREDNISONE 20 MG PO TABS: 40.0000 mg | ORAL_TABLET | Freq: Every day | ORAL | 0 refills | Status: AC

## 2022-05-03 NOTE — Progress Notes (Signed)
E-Visit for Gout Symptoms  We are sorry that you are not feeling well. We are here to help!  Based on what you shared with me it looks like you have a flare of your gout.  Gout is a form of arthritis. It can cause pain and swelling in the joints. At first, it tends to affect only 1 joint - most frequently the big toe. It happens in people who have too much uric acid in the blood. Uric acid is a chemical that is produced when the body breaks down certain foods. Uric acid can form sharp needle-like crystals that build up in the joints and cause pain. Uric acid crystals can also form inside the tubes that carry urine from the kidneys to the bladder. These crystals can turn into "kidney stones" that can cause pain and problems with the flow of urine. People with gout get sudden "flares" or attacks of severe pain, most often the big toe, ankle, or knee. Often the joint also turns red and swells. Usually, only 1 joint is affected, but some people have pain in more than 1 joint. Gout flares tend to happen more often during the night.  The pain from gout can be extreme. The pain and swelling are worst at the beginning of a gout flare. The symptoms then get better within a few days to weeks. It is not clear how the body "turns off" a gout flare.  Do not start any NEW preventative medicine until the gout has cleared completely. However, If you are already on Probenecid or Allopurinol for CHRONIC gout, you may continue taking this during an active flare up  I have prescribed Prednisone 40 mg daily for 7   HOME CARE Losing weight can help relieve gout. It's not clear that following a specific diet plan will help with gout symptoms but eating a balanced diet can help improve your overall health. It can also help you lose weight, if you are overweight. In general, a healthy diet includes plenty of fruits, vegetables, whole grains, and low-fat dairy products (labelled "low fat", skim, 2%). Avoid sugar sweetened  drinks (including sodas, tea, juice and juice blends, coffee drinks and sports drinks) Limit alcohol to 1-2 drinks of beer, spirits or wine daily these can make gout flares worse. Some people with gout also have other health problems, such as heart disease, high blood pressure, kidney disease, or obesity. If you have any of these issues, it's important to work with your doctor to manage them. This can help improve your overall health and might also help with your gout.  GET HELP RIGHT AWAY IF: Your symptoms persist after you have completed your treatment plan You develop severe diarrhea You develop abnormal sensations  You develop vomiting,   You develop weakness  You develop abdominal pain  FOLLOW UP WITH YOUR PRIMARY PROVIDER IF: If your symptoms do not improve within 10 days  MAKE SURE YOU  Understand these instructions. Will watch your condition. Will get help right away if you are not doing well or get worse.  Thank you for choosing an e-visit.  Your e-visit answers were reviewed by a board certified advanced clinical practitioner to complete your personal care plan. Depending upon the condition, your plan could have included both over the counter or prescription medications.  Please review your pharmacy choice. Make sure the pharmacy is open so you can pick up prescription now. If there is a problem, you may contact your provider through MyChart messaging and have   the prescription routed to another pharmacy.  Your safety is important to us. If you have drug allergies check your prescription carefully.   For the next 24 hours you can use MyChart to ask questions about today's visit, request a non-urgent call back, or ask for a work or school excuse. You will get an email in the next two days asking about your experience. I hope that your e-visit has been valuable and will speed your recovery.  I provided 5 minutes of non face-to-face time during this encounter for chart review and  documentation.   

## 2022-05-16 ENCOUNTER — Telehealth: Payer: BC Managed Care – PPO | Admitting: Physician Assistant

## 2022-05-16 DIAGNOSIS — J208 Acute bronchitis due to other specified organisms: Secondary | ICD-10-CM | POA: Diagnosis not present

## 2022-05-17 MED ORDER — PREDNISONE 20 MG PO TABS: 40.0000 mg | ORAL_TABLET | Freq: Every day | ORAL | 0 refills | Status: DC

## 2022-05-17 MED ORDER — BENZONATATE 100 MG PO CAPS: 100.0000 mg | ORAL_CAPSULE | Freq: Three times a day (TID) | ORAL | 0 refills | Status: DC | PRN

## 2022-05-17 NOTE — Progress Notes (Signed)
We are sorry that you are not feeling well.  Here is how we plan to help!  Based on your presentation I believe you most likely have A cough due to a virus.  This is called viral bronchitis and is best treated by rest, plenty of fluids and control of the cough.  I do not think it is a bad idea to take a home COVID test to be cautious giving your medical history and climbing rates in the area. You may use Ibuprofen or Tylenol as directed to help your symptoms.     In addition you may use A prescription cough medication called Tessalon Perles 100mg . You may take 1-2 capsules every 8 hours as needed for your cough.  I have also sent in a short burst of prednisone to help open airways and calm cough further.   From your responses in the eVisit questionnaire you describe inflammation in the upper respiratory tract which is causing a significant cough.  This is commonly called Bronchitis and has four common causes:   Allergies Viral Infections Acid Reflux Bacterial Infection Allergies, viruses and acid reflux are treated by controlling symptoms or eliminating the cause. An example might be a cough caused by taking certain blood pressure medications. You stop the cough by changing the medication. Another example might be a cough caused by acid reflux. Controlling the reflux helps control the cough.  USE OF BRONCHODILATOR ("RESCUE") INHALERS: There is a risk from using your bronchodilator too frequently.  The risk is that over-reliance on a medication which only relaxes the muscles surrounding the breathing tubes can reduce the effectiveness of medications prescribed to reduce swelling and congestion of the tubes themselves.  Although you feel brief relief from the bronchodilator inhaler, your asthma may actually be worsening with the tubes becoming more swollen and filled with mucus.  This can delay other crucial treatments, such as oral steroid medications. If you need to use a bronchodilator inhaler  daily, several times per day, you should discuss this with your provider.  There are probably better treatments that could be used to keep your asthma under control.     HOME CARE Only take medications as instructed by your medical team. Complete the entire course of an antibiotic. Drink plenty of fluids and get plenty of rest. Avoid close contacts especially the very young and the elderly Cover your mouth if you cough or cough into your sleeve. Always remember to wash your hands A steam or ultrasonic humidifier can help congestion.   GET HELP RIGHT AWAY IF: You develop worsening fever. You become short of breath You cough up blood. Your symptoms persist after you have completed your treatment plan MAKE SURE YOU  Understand these instructions. Will watch your condition. Will get help right away if you are not doing well or get worse.    Thank you for choosing an e-visit.  Your e-visit answers were reviewed by a board certified advanced clinical practitioner to complete your personal care plan. Depending upon the condition, your plan could have included both over the counter or prescription medications.  Please review your pharmacy choice. Make sure the pharmacy is open so you can pick up prescription now. If there is a problem, you may contact your provider through [employer] and have the prescription routed to another pharmacy.  Your safety is important to [place]. If you have drug allergies check your prescription carefully.   For the next 24 hours you can use MyChart to ask questions about  today's visit, request a non-urgent call back, or ask for a work or school excuse. You will get an email in the next two days asking about your experience. I hope that your e-visit has been valuable and will speed your recovery.

## 2022-05-17 NOTE — Progress Notes (Signed)
I have spent 5 minutes in review of e-visit questionnaire, review and updating patient chart, medical decision making and response to patient.   Bastien Strawser Cody Kandyce Dieguez, PA-C    

## 2022-05-31 ENCOUNTER — Telehealth: Payer: BC Managed Care – PPO | Admitting: Physician Assistant

## 2022-05-31 ENCOUNTER — Ambulatory Visit
Admission: EM | Admit: 2022-05-31 | Discharge: 2022-05-31 | Disposition: A | Discharge status: Home / Self Care | Payer: BC Managed Care – PPO

## 2022-05-31 DIAGNOSIS — J069 Acute upper respiratory infection, unspecified: Secondary | ICD-10-CM

## 2022-05-31 DIAGNOSIS — J40 Bronchitis, not specified as acute or chronic: Secondary | ICD-10-CM

## 2022-05-31 DIAGNOSIS — J329 Chronic sinusitis, unspecified: Secondary | ICD-10-CM

## 2022-05-31 MED ORDER — AMOXICILLIN-POT CLAVULANATE 875-125 MG PO TABS: 1.0000 | ORAL_TABLET | Freq: Two times a day (BID) | ORAL | 0 refills | Status: DC

## 2022-05-31 NOTE — ED Triage Notes (Signed)
Pt. Presents to UC with concern for worsening Bronchitis symptoms and concern for pneumonia. Pt. States she was diagnosed and treat for bronchitis a couple of weeks ago and her symptoms have not resolved. Pt. Endorses pain @ the base of her lungs when coughing.

## 2022-05-31 NOTE — Discharge Instructions (Addendum)
Follow up here or with your primary care provider if your symptoms are worsening or not improving with treatment.     

## 2022-05-31 NOTE — Progress Notes (Signed)
Because of persisting/recurring symptoms despite treatment via e-visit, I feel your condition warrants further evaluation and I recommend that you be seen in a face to face visit. This is our policy since we are not evaluating you face-to-face. We need to make sure nothing is getting missed and the most appropriate treatments are given.    NOTE: There will be NO CHARGE for this eVisit   If you are having a true medical emergency please call 911.      For an urgent face to face visit, [facility] has seven urgent care centers for your convenience:     [facility] [place] [facility] [place] Get Driving Directions [phone] [place], [place] [address]    [place] [facility] [place] [facility]) Get Driving Directions [phone] [place], [place] [address]  [place] [facility] [place] ([place]) Get Driving Directions [phone] [address] [place],  [place]  [address]  [place] [facility] - at [place] Get Driving Directions  [phone] [address] [place],  [place] [address]   [facility] Get Driving Directions [phone] [place], [place], [place] [address]   [facility] Get Driving Directions  [phone] [address] [place], [place] [address]   [facility] [place] Get Driving Directions [phone] [address] [place], [place] [address]  Your MyChart E-visit questionnaire answers were reviewed by a board certified advanced clinical practitioner to complete your personal care plan based on your specific symptoms.  Thank you for using e-Visits.

## 2022-05-31 NOTE — ED Provider Notes (Signed)
[facility]    CSN: [id number] Arrival date & time: 05/31/22  1523      History   Chief Complaint Chief Complaint  Patient presents with   Bronchitis    HPI Rachael Gilmore is a 56 y.o. female.   HPI  Presents to UC with symptoms x2 weeks.  Patient complains of cough that she is concerned has developed into pneumonia.  She endorses pain when coughing as well as breathing deep.  Endorses shortness of breath and sometimes difficulty speaking as a result.  Past Medical History:  Diagnosis Date   AF (amaurosis fugax) 04/16/2015   2010 Secondary to carotid artery disease.   Anxiety    Depression    Hyperlipidemia    Hyperparathyroidism (HCC) 06/11/2010   Recheck in 2020 normal.    Hypertension    Macromastia    Sleep apnea    not used CPAP in 6 yrs   Stroke [facility])    TIA (transient ischemic attack) 01/14/2016   amaurosis fugax. [facility]. MRI brain unremarkable   Tinea corporis 07/21/2015    Patient Active Problem List   Diagnosis Date Noted   Suspected Lyme disease 04/20/2022   Strain of knee 01/15/2022   History of COVID-19 06/18/2020   History of CVA (cerebrovascular accident) 04/22/2017   Seasonal affective disorder (HCC) 07/21/2015   Major depression in remission (HCC) 04/17/2015   Headache, migraine 04/16/2015   Obesity 04/16/2015   Vitamin D deficiency 04/16/2015   Anxiety 02/06/2015   Occlusion and stenosis of carotid artery 08/11/2010   Disease of thyroid gland 08/09/2010   History of secondary hyperparathyroidism 06/11/2010   Hyperlipidemia 05/05/2010   OSA (obstructive sleep apnea) 01/24/2010   Essential hypertension 03/07/2009    Past Surgical History:  Procedure Laterality Date   BREAST REDUCTION SURGERY Bilateral 11/01/2016   Procedure: BILATERAL BREAST REDUCTION WITH LIPOSUCTION;  Surgeon: Louisa Second, MD;  Location: [facility];  Service: Plastics;  Laterality: Bilateral;   BREAST SURGERY  07/2017   Revision of breast     CAROTID ENDARTERECTOMY Left 02/07/2009   [facility]   CESAREAN SECTION  1992 and 1994   KENALOG INJECTION Left 11/01/2016   Procedure: KENALOG INJECTION TO BILATERAL BREAST INCISIONS AND LEFT NECK SCAR;  Surgeon: Louisa Second, MD;  Location: [facility];  Service: Plastics;  Laterality: Left;   NECK SURGERY  07/26/2017   scar revision on left side of neck   TONSILLECTOMY  1969    OB History     Gravida  2   Para  2   Term      Preterm      AB      Living         SAB      IAB      Ectopic      Multiple      Live Births               Home Medications    Prior to Admission medications   Medication Sig Start Date End Date Taking? Authorizing Provider  albuterol (VENTOLIN HFA) 108 (90 Base) MCG/ACT inhaler Inhale 1-2 puffs into the lungs every 6 (six) hours as needed for shortness of breath. 03/23/21   Malva Limes, MD  ALPRAZolam Prudy Feeler) 0.5 MG tablet TAKE 1/2 TO 1 (ONE-HALF TO ONE) TABLET BY MOUTH TWICE DAILY AS NEEDED 04/19/22   Malva Limes, MD  aspirin 325 MG tablet Take 1 tablet by mouth daily. 03/24/09  [provider]  benzonatate (TESSALON) 100 MG capsule Take 1 capsule (100 mg total) by mouth 3 (three) times daily as needed for cough. 05/17/22   Waldon Merl, PA-C  Cholecalciferol (VITAMIN D-3) 25 MCG (1000 UT) CAPS Take 2 capsules by mouth daily.    [provider]  Coenzyme Q10 (CO Q 10) 100 MG CAPS Take 1 capsule by mouth daily.    [provider]  colchicine 0.6 MG tablet 2 tablets the first day, then one tablet daily as needed. 01/17/22   Malva Limes, MD  colchicine 0.6 MG tablet colchicine 0.6 mg tablet  2 tabs PO day one, then 1 PO daily    [provider]  indomethacin (INDOCIN) 50 MG capsule Take 1 capsule (50 mg total) by mouth 3 (three) times daily with meals. 01/05/22   Malva Limes, MD  Multiple Vitamin (MULTI-VITAMINS) TABS Take 1 tablet by mouth daily.    [provider]  nystatin ointment (MYCOSTATIN) Apply affected area twice daily as needed 08/16/17   Malva Limes, MD  pravastatin (PRAVACHOL) 40 MG tablet Take 1 tablet by mouth once daily 07/06/21   Malva Limes, MD  predniSONE (DELTASONE) 20 MG tablet Take 2 tablets (40 mg total) by mouth daily with breakfast. 05/17/22   Waldon Merl, PA-C  triamterene-hydrochlorothiazide (MAXZIDE-25) 37.5-25 MG tablet Take 1 tablet by mouth daily. 06/02/21   Malva Limes, MD    Family History Family History  Problem Relation Age of Onset   Albinism Son    Autism Son    Arrhythmia Mother    Breast cancer Sister     Social History Social History   Tobacco Use   Smoking status: Former    Types: Cigarettes    Quit date: 03/08/2009    Years since quitting: 13.2   Smokeless tobacco: Never  Substance Use Topics   Alcohol use: Yes    Comment: socially   Drug use: No     Allergies   Zoster vac recomb adjuvanted, Codeine, and Atorvastatin   Review of Systems Review of Systems   Physical Exam Triage Vital Signs ED Triage Vitals [05/31/22 1619]  Enc Vitals Group     BP 127/84     Pulse Rate 66     Resp 16     Temp 98 F (36.7 C)     Temp src      SpO2 97 %     Weight      Height      Head Circumference      Peak Flow      Pain Score 4     Pain Loc      Pain Edu?      Excl. in GC?    No data found.  Updated Vital Signs BP 127/84   Pulse 66   Temp 98 F (36.7 C)   Resp 16   SpO2 97%   Visual Acuity Right Eye Distance:   Left Eye Distance:   Bilateral Distance:    Right Eye Near:   Left Eye Near:    Bilateral Near:     Physical Exam Vitals reviewed.  Constitutional:      Appearance: Normal appearance.  Cardiovascular:     Rate and Rhythm: Normal rate and regular rhythm.     Pulses: Normal pulses.     Heart sounds: Normal heart sounds.  Pulmonary:     Effort: Pulmonary effort is normal.  Breath sounds: Normal breath sounds.  Skin:     General: Skin is warm and dry.  Neurological:     General: No focal deficit present.     Mental Status: She is alert and oriented to person, place, and time.  Psychiatric:        Mood and Affect: Mood normal.        Behavior: Behavior normal.      UC Treatments / Results  Labs (all labs ordered are listed, but only abnormal results are displayed) Labs Reviewed - No data to display  EKG   Radiology No results found.  Procedures Procedures (including critical care time)  Medications Ordered in UC Medications - No data to display  Initial Impression / Assessment and Plan / UC Course  I have reviewed the triage vital signs and the nursing notes.  Pertinent labs & imaging results that were available during my care of the patient were reviewed by me and considered in my medical decision making (see chart for details).   Symptoms now 2 weeks.  Suspect bacterial secondary infection following viral infection.  Will treat with Augmentin twice daily x7 days.   Final Clinical Impressions(s) / UC Diagnoses   Final diagnoses:  None   Discharge Instructions   None    ED Prescriptions   None    PDMP not reviewed this encounter.   Rose Phi, [place] 05/31/22 1639

## 2022-06-14 NOTE — Progress Notes (Unsigned)
I,Roshena L Chambers,acting as a scribe for Lelon Huh, MD.,have documented all relevant documentation on the behalf of Lelon Huh, MD,as directed by  Lelon Huh, MD while in the presence of Lelon Huh, MD.     Established patient visit   Patient: Rachael Gilmore   DOB: November 01, 1965   56 y.o. Female  MRN: [id number] Visit Date: 06/15/2022  Today's healthcare provider: Lelon Huh, MD   Chief Complaint  Patient presents with   Anxiety   Hypertension   Hyperlipidemia   Subjective    HPI  Anxiety, Follow-up  She was last seen for anxiety 1  year  ago. Changes made at last visit include none.   She reports good compliance with treatment. She reports good tolerance of treatment. She is not having side effects.   She feels her anxiety is mild and Unchanged since last visit.  Symptoms: No chest pain Yes difficulty concentrating  No dizziness Yes fatigue  No feelings of losing control Yes insomnia  No irritable No palpitations  Yes panic attacks No racing thoughts  No shortness of breath No sweating  No tremors/shakes    GAD-7 Results     No data to display          PHQ-9 Scores    06/15/2022    9:01 AM 01/05/2022    4:54 PM 03/23/2021    1:28 PM  PHQ9 SCORE ONLY  PHQ-9 Total Score _0 ---------------------------------------------------------------------------------------------------   Hypertension, follow-up  BP Readings from Last 3 Encounters:  06/15/22 (!) 151/59  05/31/22 127/84  01/05/22 114/76   Wt Readings from Last 3 Encounters:  06/15/22 229 lb (103.9 kg)  01/05/22 227 lb (103 kg)  03/23/21 225 lb (102.1 kg)     She was last seen for hypertension 1  year  ago.  BP at that visit was 136/75. Management since that visit includes continue same medication.  She reports good compliance with treatment. She is not having side effects.  She is following a Regular diet. She is not exercising. She does not smoke.  Use of agents  associated with hypertension: none.   Outside blood pressures are checked occasionally. Symptoms: No chest pain No chest pressure  No palpitations No syncope  No dyspnea No orthopnea  No paroxysmal nocturnal dyspnea No lower extremity edema   Pertinent labs Lab Results  Component Value Date   NA 140 03/23/2021   K 4.0 03/23/2021   CREATININE 0.69 03/23/2021   EGFR 101 03/23/2021   GLUCOSE 115 (H) 03/23/2021   TSH 1.670 06/18/2020      ---------------------------------------------------------------------------------------------------   Lipid/Cholesterol, Follow-up  Last lipid panel Other pertinent labs  Lab Results  Component Value Date   CHOL 181 03/23/2021   HDL 50 03/23/2021   LDLCALC 102 (H) 03/23/2021   TRIG 167 (H) 03/23/2021   CHOLHDL 3.6 03/23/2021   Lab Results  Component Value Date   ALT 11 03/23/2021   AST 13 03/23/2021   PLT 268 06/18/2020   TSH 1.670 06/18/2020     She was last seen for this 1  year  ago.  Management since that visit includes continuing same medication.  She reports good compliance with treatment. She is not having side effects.   Symptoms: No chest pain No chest pressure/discomfort  No dyspnea Yes lower extremity edema  No numbness or tingling of extremity No orthopnea  No palpitations No paroxysmal nocturnal dyspnea  No speech difficulty No syncope   Current diet:  in general, an "unhealthy" diet Current exercise: none  The 10-year ASCVD risk score (Arnett DK, et al., 2019) is: 4.9%  ---------------------------------------------------------------------------------------------------   Follow up for Vitamin D deficiency:  The patient was last seen for this 1  year  ago. Changes made at last visit include reducing vitamin D3 supplement to 1/2 the dose of what she was taking. Lab Results  Component Value Date   VD25OH 97.3 03/23/2021    She reports good compliance with treatment. Currently taking Vitamin D3 5000 units  daily  -----------------------------------------------------------------------------------------   She also reports having a lot of trouble with hip and joint pain. Has been seen at [facility] will mildly elevated uric acie of 7.3 and elevated CRP in Septembe.r She was referred to [facility] rheumatology and has appointment scheduled this month.  Medications: Outpatient Medications Prior to Visit  Medication Sig   albuterol (VENTOLIN HFA) 108 (90 Base) MCG/ACT inhaler Inhale 1-2 puffs into the lungs every 6 (six) hours as needed for shortness of breath.   ALPRAZolam (XANAX) 0.5 MG tablet TAKE 1/2 TO 1 (ONE-HALF TO ONE) TABLET BY MOUTH TWICE DAILY AS NEEDED   aspirin 325 MG tablet Take 1 tablet by mouth daily.   Cholecalciferol (VITAMIN D-3) 25 MCG (1000 UT) CAPS Take 5,000 Units by mouth daily.   Coenzyme Q10 (CO Q 10) 100 MG CAPS Take 1 capsule by mouth daily.   colchicine 0.6 MG tablet 2 tablets the first day, then one tablet daily as needed.   indomethacin (INDOCIN) 50 MG capsule Take 1 capsule (50 mg total) by mouth 3 (three) times daily with meals.   Multiple Vitamin (MULTI-VITAMINS) TABS Take 1 tablet by mouth daily.   nystatin ointment (MYCOSTATIN) Apply affected area twice daily as needed   pravastatin (PRAVACHOL) 40 MG tablet Take 1 tablet by mouth once daily   triamterene-hydrochlorothiazide (MAXZIDE-25) 37.5-25 MG tablet Take 1 tablet by mouth daily.   No facility-administered medications prior to visit.    Review of Systems  Constitutional:  Negative for appetite change, chills, fatigue and fever.  Respiratory:  Negative for chest tightness and shortness of breath.   Cardiovascular:  Positive for leg swelling. Negative for chest pain and palpitations.  Gastrointestinal:  Negative for abdominal pain, nausea and vomiting.  Neurological:  Negative for dizziness and weakness.       Objective    BP (!) 151/59 (BP Location: Left Arm, Patient Position: Sitting, Cuff Size: Large)    Pulse 68   Temp 98.4 F (36.9 C) (Oral)   Resp 16   Wt 229 lb (103.9 kg)   SpO2 98%   BMI 40.57 kg/m     Today's Vitals   06/15/22 0900 06/15/22 0903  BP: (!) 149/96 (!) 151/59  Pulse: 80 68  Resp: 16   Temp:  98.4 F (36.9 C)  TempSrc:  Oral  SpO2: 98%   Weight: 229 lb (103.9 kg)    Body mass index is 40.57 kg/m.   Physical Exam  General appearance: Obese female, cooperative and in no acute distress Head: Normocephalic, without obvious abnormality, atraumatic Respiratory: Respirations even and unlabored, normal respiratory rate Extremities: All extremities are intact.  Skin: Skin color, texture, turgor normal. No rashes seen  Psych: Appropriate mood and affect. Neurologic: Mental status: Alert, oriented to person, place, and time, thought content appropriate.   Assessment & Plan     1. Essential hypertension Not at goal today, but she states she is just getting over URI and home BP is usually  much lower. Is doing well current medications.   2. Anxiety Stable, refill ALPRAZolam (XANAX) 0.5 MG tablet; TAKE 1/2 TO 1 (ONE-HALF TO ONE) TABLET BY MOUTH TWICE DAILY AS NEEDED  Dispense: 60 tablet; Refill: 1  3. Hyperlipidemia, unspecified hyperlipidemia type She is tolerating pravastatin well with no adverse effects.   - Comprehensive metabolic panel - Lipid panel - TSH - CK (Creatine Kinase)  4. Vitamin D deficiency  5. Major depression in remission ([place])   6. Hyperglycemia  - Hemoglobin A1c  7. Arthralgia, unspecified joint Has rheumatology evaluation scheduled this month.   She declined recommended flu vaccine She is overdue for routine mammogram, pap, and pelvic. She states she is planning on calling to establish with new gyn soon.       The entirety of the information documented in the History of Present Illness, Review of Systems and Physical Exam were personally obtained by me. Portions of this information were initially documented by the CMA and  reviewed by me for thoroughness and accuracy.     Lelon Huh, MD  [facility] [phone] (phone) [phone] (fax)  [place]

## 2022-06-15 ENCOUNTER — Ambulatory Visit (INDEPENDENT_AMBULATORY_CARE_PROVIDER_SITE_OTHER): Payer: BC Managed Care – PPO | Admitting: Family Medicine

## 2022-06-15 ENCOUNTER — Encounter: Payer: Self-pay | Admitting: Family Medicine

## 2022-06-15 VITALS — BP 151/59 | HR 68 | Temp 98.4°F | Resp 16 | Wt 229.0 lb

## 2022-06-15 DIAGNOSIS — I779 Disorder of arteries and arterioles, unspecified: Secondary | ICD-10-CM

## 2022-06-15 DIAGNOSIS — I10 Essential (primary) hypertension: Secondary | ICD-10-CM

## 2022-06-15 DIAGNOSIS — R739 Hyperglycemia, unspecified: Secondary | ICD-10-CM

## 2022-06-15 DIAGNOSIS — F325 Major depressive disorder, single episode, in full remission: Secondary | ICD-10-CM

## 2022-06-15 DIAGNOSIS — E785 Hyperlipidemia, unspecified: Secondary | ICD-10-CM | POA: Diagnosis not present

## 2022-06-15 DIAGNOSIS — E559 Vitamin D deficiency, unspecified: Secondary | ICD-10-CM | POA: Diagnosis not present

## 2022-06-15 DIAGNOSIS — F419 Anxiety disorder, unspecified: Secondary | ICD-10-CM | POA: Diagnosis not present

## 2022-06-15 DIAGNOSIS — M2550 Pain in unspecified joint: Secondary | ICD-10-CM

## 2022-06-15 MED ORDER — PRAVASTATIN SODIUM 40 MG PO TABS: 40.0000 mg | ORAL_TABLET | Freq: Every day | ORAL | 4 refills | Status: AC

## 2022-06-15 MED ORDER — ALPRAZOLAM 0.5 MG PO TABS: ORAL_TABLET | ORAL | 1 refills | Status: DC

## 2022-06-15 NOTE — Patient Instructions (Signed)
.   Please review the attached list of medications and notify my office if there are any errors.   . Please bring all of your medications to every appointment so we can make sure that our medication list is the same as yours.   

## 2022-06-16 LAB — COMPREHENSIVE METABOLIC PANEL
ALT: 13 IU/L (ref 0–32)
AST: 15 IU/L (ref 0–40)
Albumin/Globulin Ratio: 2.4 — ABNORMAL HIGH (ref 1.2–2.2)
Albumin: 4.6 g/dL (ref 3.8–4.9)
Alkaline Phosphatase: 88 IU/L (ref 44–121)
BUN/Creatinine Ratio: 16 (ref 9–23)
BUN: 11 mg/dL (ref 6–24)
Bilirubin Total: 0.3 mg/dL (ref 0.0–1.2)
CO2: 26 mmol/L (ref 20–29)
Calcium: 9.7 mg/dL (ref 8.7–10.2)
Chloride: 100 mmol/L (ref 96–106)
Creatinine, Ser: 0.68 mg/dL (ref 0.57–1.00)
Globulin, Total: 1.9 g/dL (ref 1.5–4.5)
Glucose: 132 mg/dL — ABNORMAL HIGH (ref 70–99)
Potassium: 4.6 mmol/L (ref 3.5–5.2)
Sodium: 140 mmol/L (ref 134–144)
Total Protein: 6.5 g/dL (ref 6.0–8.5)
eGFR: 101 mL/min/{1.73_m2} (ref >59)

## 2022-06-16 LAB — LIPID PANEL
Chol/HDL Ratio: 5.3 ratio — ABNORMAL HIGH (ref 0.0–4.4)
Cholesterol, Total: 253 mg/dL — ABNORMAL HIGH (ref 100–199)
HDL: 48 mg/dL (ref >39)
LDL Chol Calc (NIH): 164 mg/dL — ABNORMAL HIGH (ref 0–99)
Triglycerides: 221 mg/dL — ABNORMAL HIGH (ref 0–149)
VLDL Cholesterol Cal: 41 mg/dL — ABNORMAL HIGH (ref 5–40)

## 2022-06-16 LAB — HEMOGLOBIN A1C
Est. average glucose Bld gHb Est-mCnc: 128 mg/dL
Hgb A1c MFr Bld: 6.1 % — ABNORMAL HIGH (ref 4.8–5.6)

## 2022-06-16 LAB — TSH: TSH: 1.73 u[IU]/mL (ref 0.450–4.500)

## 2022-06-16 LAB — CK: Total CK: 28 U/L — ABNORMAL LOW (ref 32–182)

## 2022-06-22 DIAGNOSIS — M79652 Pain in left thigh: Secondary | ICD-10-CM | POA: Diagnosis not present

## 2022-06-22 DIAGNOSIS — M25571 Pain in right ankle and joints of right foot: Secondary | ICD-10-CM | POA: Diagnosis not present

## 2022-06-22 DIAGNOSIS — Z79899 Other long term (current) drug therapy: Secondary | ICD-10-CM | POA: Diagnosis not present

## 2022-06-22 DIAGNOSIS — G8929 Other chronic pain: Secondary | ICD-10-CM | POA: Diagnosis not present

## 2022-06-30 DIAGNOSIS — M79605 Pain in left leg: Secondary | ICD-10-CM | POA: Diagnosis not present

## 2022-06-30 DIAGNOSIS — M25571 Pain in right ankle and joints of right foot: Secondary | ICD-10-CM | POA: Diagnosis not present

## 2022-07-06 DIAGNOSIS — M25571 Pain in right ankle and joints of right foot: Secondary | ICD-10-CM | POA: Diagnosis not present

## 2022-07-06 DIAGNOSIS — M79605 Pain in left leg: Secondary | ICD-10-CM | POA: Diagnosis not present

## 2022-07-13 DIAGNOSIS — M25571 Pain in right ankle and joints of right foot: Secondary | ICD-10-CM | POA: Diagnosis not present

## 2022-07-13 DIAGNOSIS — M79605 Pain in left leg: Secondary | ICD-10-CM | POA: Diagnosis not present

## 2022-07-16 DIAGNOSIS — M25571 Pain in right ankle and joints of right foot: Secondary | ICD-10-CM | POA: Diagnosis not present

## 2022-07-16 DIAGNOSIS — M79605 Pain in left leg: Secondary | ICD-10-CM | POA: Diagnosis not present

## 2022-07-20 DIAGNOSIS — M25571 Pain in right ankle and joints of right foot: Secondary | ICD-10-CM | POA: Diagnosis not present

## 2022-07-20 DIAGNOSIS — M79605 Pain in left leg: Secondary | ICD-10-CM | POA: Diagnosis not present

## 2022-07-23 DIAGNOSIS — M25571 Pain in right ankle and joints of right foot: Secondary | ICD-10-CM | POA: Diagnosis not present

## 2022-07-23 DIAGNOSIS — M79605 Pain in left leg: Secondary | ICD-10-CM | POA: Diagnosis not present

## 2022-07-27 DIAGNOSIS — M25571 Pain in right ankle and joints of right foot: Secondary | ICD-10-CM | POA: Diagnosis not present

## 2022-07-27 DIAGNOSIS — M79605 Pain in left leg: Secondary | ICD-10-CM | POA: Diagnosis not present

## 2022-07-30 DIAGNOSIS — M25571 Pain in right ankle and joints of right foot: Secondary | ICD-10-CM | POA: Diagnosis not present

## 2022-07-30 DIAGNOSIS — M79605 Pain in left leg: Secondary | ICD-10-CM | POA: Diagnosis not present

## 2022-08-03 DIAGNOSIS — M25571 Pain in right ankle and joints of right foot: Secondary | ICD-10-CM | POA: Diagnosis not present

## 2022-08-03 DIAGNOSIS — M79605 Pain in left leg: Secondary | ICD-10-CM | POA: Diagnosis not present

## 2022-08-06 DIAGNOSIS — M79605 Pain in left leg: Secondary | ICD-10-CM | POA: Diagnosis not present

## 2022-08-06 DIAGNOSIS — M25571 Pain in right ankle and joints of right foot: Secondary | ICD-10-CM | POA: Diagnosis not present

## 2022-08-10 DIAGNOSIS — M79605 Pain in left leg: Secondary | ICD-10-CM | POA: Diagnosis not present

## 2022-08-10 DIAGNOSIS — M25571 Pain in right ankle and joints of right foot: Secondary | ICD-10-CM | POA: Diagnosis not present

## 2022-08-13 DIAGNOSIS — M25571 Pain in right ankle and joints of right foot: Secondary | ICD-10-CM | POA: Diagnosis not present

## 2022-08-13 DIAGNOSIS — M79605 Pain in left leg: Secondary | ICD-10-CM | POA: Diagnosis not present

## 2022-08-16 ENCOUNTER — Other Ambulatory Visit: Payer: Self-pay | Admitting: Family Medicine

## 2022-08-16 DIAGNOSIS — M25571 Pain in right ankle and joints of right foot: Secondary | ICD-10-CM

## 2022-08-17 DIAGNOSIS — M25571 Pain in right ankle and joints of right foot: Secondary | ICD-10-CM | POA: Diagnosis not present

## 2022-08-17 DIAGNOSIS — M79605 Pain in left leg: Secondary | ICD-10-CM | POA: Diagnosis not present

## 2022-08-20 DIAGNOSIS — M25571 Pain in right ankle and joints of right foot: Secondary | ICD-10-CM | POA: Diagnosis not present

## 2022-08-20 DIAGNOSIS — M79605 Pain in left leg: Secondary | ICD-10-CM | POA: Diagnosis not present

## 2022-08-24 DIAGNOSIS — M79605 Pain in left leg: Secondary | ICD-10-CM | POA: Diagnosis not present

## 2022-08-24 DIAGNOSIS — M25571 Pain in right ankle and joints of right foot: Secondary | ICD-10-CM | POA: Diagnosis not present

## 2022-08-31 DIAGNOSIS — M79605 Pain in left leg: Secondary | ICD-10-CM | POA: Diagnosis not present

## 2022-08-31 DIAGNOSIS — M25571 Pain in right ankle and joints of right foot: Secondary | ICD-10-CM | POA: Diagnosis not present

## 2022-09-03 DIAGNOSIS — M79605 Pain in left leg: Secondary | ICD-10-CM | POA: Diagnosis not present

## 2022-09-03 DIAGNOSIS — M25571 Pain in right ankle and joints of right foot: Secondary | ICD-10-CM | POA: Diagnosis not present

## 2022-09-04 ENCOUNTER — Telehealth: Payer: BC Managed Care – PPO | Admitting: Physician Assistant

## 2022-09-04 ENCOUNTER — Encounter: Payer: Self-pay | Admitting: Physician Assistant

## 2022-09-04 DIAGNOSIS — J069 Acute upper respiratory infection, unspecified: Secondary | ICD-10-CM

## 2022-09-04 MED ORDER — ALBUTEROL SULFATE HFA 108 (90 BASE) MCG/ACT IN AERS: 1.0000 | INHALATION_SPRAY | Freq: Four times a day (QID) | RESPIRATORY_TRACT | 0 refills | Status: AC | PRN

## 2022-09-04 MED ORDER — PREDNISONE 20 MG PO TABS: ORAL_TABLET | ORAL | 0 refills | Status: AC

## 2022-09-04 MED ORDER — BENZONATATE 100 MG PO CAPS: ORAL_CAPSULE | ORAL | 0 refills | Status: DC

## 2022-09-04 NOTE — Progress Notes (Signed)
We are sorry that you are not feeling well.  Here is how we plan to help!  Based on your presentation I believe you most likely have A cough due to a virus.  This is called viral bronchitis and is best treated by rest, plenty of fluids and control of the cough.  You may use Ibuprofen or Tylenol as directed to help your symptoms.     In addition you may use A prescription cough medication called Tessalon Perles 100mg . You may take 1-2 capsules every 8 hours as needed for your cough. A Predisone taper was prescribed as well. I also sent a refill of your inhaler.  From your responses in the eVisit questionnaire you describe inflammation in the upper respiratory tract which is causing a significant cough.  This is commonly called Bronchitis and has four common causes:   Allergies Viral Infections Acid Reflux Bacterial Infection Allergies, viruses and acid reflux are treated by controlling symptoms or eliminating the cause. An example might be a cough caused by taking certain blood pressure medications. You stop the cough by changing the medication. Another example might be a cough caused by acid reflux. Controlling the reflux helps control the cough.  USE OF BRONCHODILATOR ("RESCUE") INHALERS: There is a risk from using your bronchodilator too frequently.  The risk is that over-reliance on a medication which only relaxes the muscles surrounding the breathing tubes can reduce the effectiveness of medications prescribed to reduce swelling and congestion of the tubes themselves.  Although you feel brief relief from the bronchodilator inhaler, your asthma may actually be worsening with the tubes becoming more swollen and filled with mucus.  This can delay other crucial treatments, such as oral steroid medications. If you need to use a bronchodilator inhaler daily, several times per day, you should discuss this with your provider.  There are probably better treatments that could be used to keep your asthma  under control.     HOME CARE Only take medications as instructed by your medical team. Complete the entire course of an antibiotic. Drink plenty of fluids and get plenty of rest. Avoid close contacts especially the very young and the elderly Cover your mouth if you cough or cough into your sleeve. Always remember to wash your hands A steam or ultrasonic humidifier can help congestion.   GET HELP RIGHT AWAY IF: You develop worsening fever. You become short of breath You cough up blood. Your symptoms persist after you have completed your treatment plan MAKE SURE YOU  Understand these instructions. Will watch your condition. Will get help right away if you are not doing well or get worse.    Thank you for choosing an e-visit.  Your e-visit answers were reviewed by a board certified advanced clinical practitioner to complete your personal care plan. Depending upon the condition, your plan could have included both over the counter or prescription medications.  Please review your pharmacy choice. Make sure the pharmacy is open so you can pick up prescription now. If there is a problem, you may contact your provider through [employer] and have the prescription routed to another pharmacy.  Your safety is important to [place]. If you have drug allergies check your prescription carefully.   For the next 24 hours you can use MyChart to ask questions about today's visit, request a non-urgent call back, or ask for a work or school excuse. You will get an email in the next two days asking about your experience. I hope that your  e-visit has been valuable and will speed your recovery.  I have spent 5 minutes in review of e-visit questionnaire, review and updating patient chart, medical decision making and response to patient.   Loraine Grip Mayers, PA-C

## 2022-09-07 ENCOUNTER — Telehealth: Payer: Self-pay

## 2022-09-07 ENCOUNTER — Encounter: Payer: Self-pay | Admitting: Physician Assistant

## 2022-09-07 ENCOUNTER — Telehealth (INDEPENDENT_AMBULATORY_CARE_PROVIDER_SITE_OTHER): Payer: BC Managed Care – PPO | Admitting: Physician Assistant

## 2022-09-07 VITALS — HR 66 | Temp 97.5°F

## 2022-09-07 DIAGNOSIS — U071 COVID-19: Secondary | ICD-10-CM

## 2022-09-07 MED ORDER — NIRMATRELVIR/RITONAVIR (PAXLOVID)TABLET: 3.0000 | ORAL_TABLET | Freq: Two times a day (BID) | ORAL | 0 refills | Status: AC

## 2022-09-07 MED ORDER — MOLNUPIRAVIR EUA 200MG CAPSULE: 4.0000 | ORAL_CAPSULE | Freq: Two times a day (BID) | ORAL | 0 refills | Status: DC

## 2022-09-07 NOTE — Telephone Encounter (Signed)
Can try Paxlovid, have sent prescription, although may have the same problem with insurance.

## 2022-09-07 NOTE — Telephone Encounter (Signed)
Copied from [place] [id number]. Topic: General - Other >> Sep 07, 2022 11:39 AM Everette C wrote: Reason for CRM: The patient has been in contact with their pharmacy and been told that their molnupiravir EUA (LAGEVRIO) 200 mg CAPS capsule [[id number]] is more than $1000 out of pocket   The patient would like to be prescribed a cost effective alternative when possible   The patient shares that they have continued to experience insurance issues   Please contact further when possible

## 2022-09-07 NOTE — Progress Notes (Signed)
I,Rachael Gilmore,acting as a Education administrator for [employer], PA-C.,have documented all relevant documentation on the behalf of Rachael Kirschner, PA-C,as directed by  Rachael Kirschner, PA-C while in the presence of Rachael Kirschner, PA-C.  MyChart Video Visit    Virtual Visit via Video Note   This format is felt to be most appropriate for this patient at this time. Physical exam was limited by quality of the video and audio technology used for the visit.   Patient location: Home Provider location: [facility]  I discussed the limitations of evaluation and management by telemedicine and the availability of in person appointments. The patient expressed understanding and agreed to proceed.  Patient: Rachael Gilmore   DOB: 1966-06-02   57 y.o. Female  MRN: [id number] Visit Date: 09/07/2022  Today's healthcare provider: [name], PA-C   Cc. Covid positive yesterday  Subjective    HPI  Symptoms: lung discomfort, fever, chills, congestion(chest and nasal), cough with phlegm (green color), runny nose Fever: yes 101 last night taken orally Tested:Saturday night test was negative but yesterday was positive Treatment tried: otc cough medicines Evisit: 1/27 and given steroid and pearls Reports SOB improved with inhaler and steroids.   Medications: Outpatient Medications Prior to Visit  Medication Sig   albuterol (VENTOLIN HFA) 108 (90 Base) MCG/ACT inhaler Inhale 1-2 puffs into the lungs every 6 (six) hours as needed for shortness of breath.   ALPRAZolam (XANAX) 0.5 MG tablet TAKE 1/2 TO 1 (ONE-HALF TO ONE) TABLET BY MOUTH TWICE DAILY AS NEEDED   aspirin 325 MG tablet Take 1 tablet by mouth daily.   benzonatate (TESSALON) 100 MG capsule Take 1-2 caps PO TID PRN   Cholecalciferol (VITAMIN D-3) 25 MCG (1000 UT) CAPS Take 5,000 Units by mouth daily.   Coenzyme Q10 (CO Q 10) 100 MG CAPS Take 1 capsule by mouth daily.   colchicine 0.6 MG tablet TAKE TWO TABLETS BY MOUTH THE  FIRST DAY. THEN TAKE ONE TABLET BY MOUTH DAILY AS NEEDED   indomethacin (INDOCIN) 50 MG capsule Take 1 capsule (50 mg total) by mouth 3 (three) times daily with meals.   Multiple Vitamin (MULTI-VITAMINS) TABS Take 1 tablet by mouth daily.   nystatin ointment (MYCOSTATIN) Apply affected area twice daily as needed   pravastatin (PRAVACHOL) 40 MG tablet Take 1 tablet (40 mg total) by mouth daily.   predniSONE (DELTASONE) 20 MG tablet Take 3 tablets (60 mg total) by mouth daily with breakfast for 2 days, THEN 2 tablets (40 mg total) daily with breakfast for 2 days, THEN 1 tablet (20 mg total) daily with breakfast for 2 days, THEN 0.5 tablets (10 mg total) daily with breakfast for 2 days.   triamterene-hydrochlorothiazide (MAXZIDE-25) 37.5-25 MG tablet Take 1 tablet by mouth daily.   No facility-administered medications prior to visit.    Review of Systems  Constitutional:  Positive for chills, fatigue and fever.  HENT:  Positive for congestion, postnasal drip, rhinorrhea, sinus pressure, sinus pain and sore throat.   Respiratory:  Positive for cough and shortness of breath.   Cardiovascular:  Negative for chest pain and leg swelling.  Gastrointestinal:  Negative for abdominal pain.  Neurological:  Positive for headaches. Negative for dizziness.      Objective    Pulse 66   Temp (!) 97.5 F (36.4 C)   SpO2 97%     Physical Exam Constitutional:      General: She is not in acute distress.    Appearance: She  is not ill-appearing.  Neurological:     Mental Status: She is oriented to person, place, and time.  Psychiatric:        Mood and Affect: Mood normal.        Behavior: Behavior normal.        Assessment & Plan     COVID 19 Continue already prescribed therapies-- inhaler, steroids Increase fluids, tylenol Rx molnupiravir antiviral  Any changes or worsening of symptoms please call office or go to ED  Return if symptoms worsen or fail to improve.     I discussed the  assessment and treatment plan with the patient. The patient was provided an opportunity to ask questions and all were answered. The patient agreed with the plan and demonstrated an understanding of the instructions.   The patient was advised to call back or seek an in-person evaluation if the symptoms worsen or if the condition fails to improve as anticipated.  I provided 8 minutes of non-face-to-face time during this encounter.  I, Rachael Kirschner, PA-C have reviewed all documentation for this visit. The documentation on  09/07/22  for the exam, diagnosis, procedures, and orders are all accurate and complete.  Rachael Kirschner, PA-C [facility] [address] [place], [place], [address] Office: [phone] Fax: [place]

## 2022-09-08 ENCOUNTER — Ambulatory Visit: Payer: Self-pay | Admitting: *Deleted

## 2022-09-08 MED ORDER — AZITHROMYCIN 250 MG PO TABS: ORAL_TABLET | ORAL | 0 refills | Status: AC

## 2022-09-08 NOTE — Telephone Encounter (Signed)
States she was advised by Juliann Pulse if not able to be filled due to cost she would be sent zpack and prednisone.

## 2022-09-08 NOTE — Telephone Encounter (Signed)
Pt. States molnuoiravir is $1100. "I can't afford that."Asking for antibiotic and Prednisone offered yesterday. Please advise pt.

## 2022-09-08 NOTE — Telephone Encounter (Signed)
  Chief Complaint: medication problem Symptoms: covid pos, cough and congestion Frequency:  Pertinent Negatives: NA Disposition: [] ED /[] Urgent Care (no appt availability in office) / [] Appointment(In office/virtual)/ []  [place] [facility]/ [] Home Care/ [] Refused Recommended Disposition /[] [place] [facility]/ []  Follow-up with PCP Additional Notes: advised pt that Paxlovid was sent in yesterday after she called in since the molnupiravir was too expensive. Recommended pt call pharmacy to check on pricing and if unable to afford that to call back and will send message back to Dr. Caryn Section requesting prednisone and Zpac like pt wanting. Pt was going to call pharmacy.   Summary: Call yesterday, no response Covid med way too high sick  needs fu /no office response   nirmatrelvir/ritonavir (PAXLOVID) 20 x 150 MG & 10 x 100MG  TABS Pls fu with pt has covid and is taking nothing for infection Appt was yesterday and went to pu med and with ins it was over $1,000? States that she left a message yesterday stating she could not get med and noone is calling her back. States she has had covid and before and she knows it will probably go into Pneumonia. Pls fu [phone] She wants prednisone and antibiotic like before. Visit was yeserday/ CB [phone]     Answer Assessment - Initial Assessment Questions 1. NAME of MEDICINE: "What medicine(s) are you calling about?"     molnupivir 2. QUESTION: "What is your question?" (e.g., double dose of medicine, side effect)     Cost too high over $1000 3. PRESCRIBER: "Who prescribed the medicine?" Reason: if prescribed by specialist, call should be referred to that group.     Ria Comment, [place] 4. SYMPTOMS: "Do you have any symptoms?" If Yes, ask: "What symptoms are you having?"  "How bad are the symptoms (e.g., mild, moderate, severe)     Covid pos  Protocols used: Medication Question Call-A-AH

## 2022-09-08 NOTE — Addendum Note (Signed)
Addended by: Birdie Sons on: 09/08/2022 01:22 PM   Modules accepted: Orders

## 2022-09-08 NOTE — Telephone Encounter (Signed)
Patient advised. Verbalized understanding 

## 2022-09-08 NOTE — Telephone Encounter (Signed)
Summary: Call yesterday, no response Covid med way too high sick  needs fu /no office response   nirmatrelvir/ritonavir (PAXLOVID) 20 x 150 MG & 10 x 100MG  TABS Pls fu with pt has covid and is taking nothing for infection Appt was yesterday and went to pu med and with ins it was over $1,000? States that she left a message yesterday stating she could not get med and noone is calling her back. States she has had covid and before and she knows it will probably go into Pneumonia. Pls fu [phone] She wants prednisone and antibiotic like before. Visit was yeserday/ CB [phone]         Attempted to call patient- left message to call office

## 2022-09-23 ENCOUNTER — Other Ambulatory Visit: Payer: Self-pay | Admitting: Family Medicine

## 2022-09-23 DIAGNOSIS — F419 Anxiety disorder, unspecified: Secondary | ICD-10-CM

## 2022-11-05 ENCOUNTER — Telehealth: Payer: BC Managed Care – PPO | Admitting: Physician Assistant

## 2022-11-05 DIAGNOSIS — J020 Streptococcal pharyngitis: Secondary | ICD-10-CM

## 2022-11-05 MED ORDER — AMOXICILLIN 500 MG PO CAPS: 500.0000 mg | ORAL_CAPSULE | Freq: Two times a day (BID) | ORAL | 0 refills | Status: AC

## 2022-11-05 NOTE — Progress Notes (Signed)

## 2023-04-12 DIAGNOSIS — H25013 Cortical age-related cataract, bilateral: Secondary | ICD-10-CM | POA: Diagnosis not present

## 2023-04-12 DIAGNOSIS — H2513 Age-related nuclear cataract, bilateral: Secondary | ICD-10-CM | POA: Diagnosis not present

## 2023-04-12 DIAGNOSIS — H43811 Vitreous degeneration, right eye: Secondary | ICD-10-CM | POA: Diagnosis not present

## 2023-04-12 DIAGNOSIS — H04123 Dry eye syndrome of bilateral lacrimal glands: Secondary | ICD-10-CM | POA: Diagnosis not present

## 2023-05-15 ENCOUNTER — Telehealth: Payer: BC Managed Care – PPO | Admitting: Physician Assistant

## 2023-05-15 DIAGNOSIS — B9689 Other specified bacterial agents as the cause of diseases classified elsewhere: Secondary | ICD-10-CM

## 2023-05-15 DIAGNOSIS — J019 Acute sinusitis, unspecified: Secondary | ICD-10-CM | POA: Diagnosis not present

## 2023-05-15 MED ORDER — AMOXICILLIN-POT CLAVULANATE 875-125 MG PO TABS: 1.0000 | ORAL_TABLET | Freq: Two times a day (BID) | ORAL | 0 refills | Status: DC

## 2023-05-15 NOTE — Progress Notes (Signed)

## 2023-06-12 ENCOUNTER — Other Ambulatory Visit: Payer: Self-pay | Admitting: Family Medicine

## 2023-06-12 DIAGNOSIS — F419 Anxiety disorder, unspecified: Secondary | ICD-10-CM

## 2023-08-12 ENCOUNTER — Other Ambulatory Visit: Payer: Self-pay | Admitting: Family Medicine

## 2023-08-12 DIAGNOSIS — F419 Anxiety disorder, unspecified: Secondary | ICD-10-CM

## 2023-08-12 NOTE — Telephone Encounter (Signed)
 Medication Refill -  Most Recent Primary Care Visit:  Provider: CYNDI SHAVER  Department: [facility]  Visit Type: MYCHART VIDEO VISIT  Date: 09/07/2022  Medication: ALPRAZolam  (XANAX ) 0.5 MG tablet   Has the patient contacted their pharmacy? Yes   Is this the correct pharmacy for this prescription? Yes If no, delete pharmacy and type the correct one.  This is the patient's preferred pharmacy:   ALPRAZolam  (XANAX ) 0.5 MG tablet   Has the prescription been filled recently? No  Is the patient out of the medication? Yes  Has the patient been seen for an appointment in the last year OR does the patient have an upcoming appointment? Yes  Can we respond through MyChart? Yes   Patient is fearful there will be a snowstorm on Monday and she won't be able to get out for her appt and needs this med as she has been on it for years. Please assist patient further.

## 2023-08-15 ENCOUNTER — Telehealth (INDEPENDENT_AMBULATORY_CARE_PROVIDER_SITE_OTHER): Payer: BC Managed Care – PPO | Admitting: Family Medicine

## 2023-08-15 DIAGNOSIS — Z87891 Personal history of nicotine dependence: Secondary | ICD-10-CM

## 2023-08-15 DIAGNOSIS — F419 Anxiety disorder, unspecified: Secondary | ICD-10-CM

## 2023-08-15 DIAGNOSIS — Z8673 Personal history of transient ischemic attack (TIA), and cerebral infarction without residual deficits: Secondary | ICD-10-CM

## 2023-08-15 DIAGNOSIS — I779 Disorder of arteries and arterioles, unspecified: Secondary | ICD-10-CM

## 2023-08-15 DIAGNOSIS — I10 Essential (primary) hypertension: Secondary | ICD-10-CM | POA: Diagnosis not present

## 2023-08-15 DIAGNOSIS — Z91199 Patient's noncompliance with other medical treatment and regimen due to unspecified reason: Secondary | ICD-10-CM

## 2023-08-15 DIAGNOSIS — E785 Hyperlipidemia, unspecified: Secondary | ICD-10-CM

## 2023-08-15 MED ORDER — ALPRAZOLAM 0.5 MG PO TABS: ORAL_TABLET | ORAL | 0 refills | Status: DC

## 2023-08-15 NOTE — Telephone Encounter (Signed)
 Requested medication (s) are due for refill today - yes  Requested medication (s) are on the active medication list -yes  Future visit scheduled yes- today  Last refill: 06/12/23 #60   Notes to clinic: non delegated Rx  Requested Prescriptions  Pending Prescriptions Disp Refills   ALPRAZolam  (XANAX ) 0.5 MG tablet 60 tablet 0    Sig: TAKE 1/2 TO 1 (ONE-HALF TO ONE) TABLET BY MOUTH TWICE DAILY AS NEEDED     Not Delegated - Psychiatry: Anxiolytics/Hypnotics 2 Failed - 08/15/2023  9:28 AM      Failed - This refill cannot be delegated      Failed - Urine Drug Screen completed in last 360 days      Failed - Valid encounter within last 6 months    Recent Outpatient Visits           11 months ago COVID-19   [facility] Cyndi Shaver, PA-C   1 year ago Essential hypertension   [facility] Gasper Nancyann BRAVO, MD   1 year ago Acute right ankle pain   [facility] [facility] Gasper Nancyann BRAVO, MD   2 years ago Vitamin D  deficiency   [facility] Gasper Nancyann BRAVO, MD   3 years ago Essential hypertension   [facility] Gasper Nancyann BRAVO, MD       Future Appointments             Today Gasper, Nancyann BRAVO, MD [facility]            Passed - Patient is not pregnant         Requested Prescriptions  Pending Prescriptions Disp Refills   ALPRAZolam  (XANAX ) 0.5 MG tablet 60 tablet 0    Sig: TAKE 1/2 TO 1 (ONE-HALF TO ONE) TABLET BY MOUTH TWICE DAILY AS NEEDED     Not Delegated - Psychiatry: Anxiolytics/Hypnotics 2 Failed - 08/15/2023  9:28 AM      Failed - This refill cannot be delegated      Failed - Urine Drug Screen completed in last 360 days      Failed - Valid encounter within last 6 months    Recent Outpatient Visits           11 months ago COVID-19   [facility] Cyndi Shaver, PA-C   1 year  ago Essential hypertension   [facility] Gasper Nancyann BRAVO, MD   1 year ago Acute right ankle pain   [facility] Gasper Nancyann BRAVO, MD   2 years ago Vitamin D  deficiency   [facility] Gasper Nancyann BRAVO, MD   3 years ago Essential hypertension   [place] [facility] Gasper Nancyann BRAVO, MD       Future Appointments             Today Fisher, Nancyann BRAVO, MD [facility]            Passed - Patient is not pregnant

## 2023-08-22 NOTE — Progress Notes (Signed)
 Established patient visit   Patient: Rachael Gilmore   DOB: Apr 28, 1966   58 y.o. Female  MRN: [id number] Visit Date: 08/15/2023  Today's healthcare provider: Nancyann Perry, MD    Subjective    Discussed the use of AI scribe software for clinical note transcription with the patient, who gave verbal consent to proceed.  History of Present Illness   The patient, with a history of anxiety, hypertension, and a remote history of CVA, presents for a medication refill. She reported running out of her anxiety medication, alprazolam , which she has been taking for about ten years. The medication has been effective, typically requiring one dose at night to aid sleep and half a dose sporadically during the day. She reported no side effects from the medication.  The patient also discussed her hypertension and lipid management. She was prescribed antihypertensive medications after her stroke.  However, she reported experiencing significant foot swelling while on the medication. After noticing a decrease in swelling and a drop in blood pressure readings, she decided to taper off the medication. Her last prescribed antihypertensive medication was triamterene -hctz which has not been dispensed since July of 2023. She reports that fhr blood pressure has since been stable around 124/79.  The patient also mentioned a reluctance to take statin drugs due to concerns about potential links to dementia, a condition her mother is currently suffering from. She has not had pravastatin  dispensed since November of 2023.  Lastly, the patient expressed dissatisfaction with a recent interaction with a receptionist during a phone call to the office. She felt the receptionist questioned her medication usage and overall found the interaction to be negative. she insists that someone go back and listen to the recorded phone call.       Medications: Outpatient Medications Prior to Visit  Medication Sig   albuterol   (VENTOLIN  HFA) 108 (90 Base) MCG/ACT inhaler Inhale 1-2 puffs into the lungs every 6 (six) hours as needed for shortness of breath.   aspirin 325 MG tablet Take 1 tablet by mouth daily.   [DISCONTINUED] ALPRAZolam  (XANAX ) 0.5 MG tablet TAKE 1/2 TO 1 (ONE-HALF TO ONE) TABLET BY MOUTH TWICE DAILY AS NEEDED   amoxicillin -clavulanate (AUGMENTIN ) 875-125 MG tablet Take 1 tablet by mouth 2 (two) times daily. (Patient not taking: Reported on 08/15/2023)   benzonatate  (TESSALON ) 100 MG capsule Take 1-2 caps PO TID PRN (Patient not taking: Reported on 08/15/2023)   Cholecalciferol (VITAMIN D -3) 25 MCG (1000 UT) CAPS Take 5,000 Units by mouth daily.   Coenzyme Q10 (CO Q 10) 100 MG CAPS Take 1 capsule by mouth daily.   colchicine  0.6 MG tablet TAKE TWO TABLETS BY MOUTH THE FIRST DAY. THEN TAKE ONE TABLET BY MOUTH DAILY AS NEEDED   indomethacin  (INDOCIN ) 50 MG capsule Take 1 capsule (50 mg total) by mouth 3 (three) times daily with meals.   Multiple Vitamin (MULTI-VITAMINS) TABS Take 1 tablet by mouth daily.   nystatin  ointment (MYCOSTATIN ) Apply affected area twice daily as needed   pravastatin  (PRAVACHOL ) 40 MG tablet Take 1 tablet (40 mg total) by mouth daily. (Patient not taking: Reported on 08/15/2023)   triamterene -hydrochlorothiazide (MAXZIDE-25) 37.5-25 MG tablet Take 1 tablet by mouth daily. (Patient not taking: Reported on 08/15/2023)   No facility-administered medications prior to visit.   Review of Systems     Objective    There were no vitals taken for this visit.  Physical Exam  Awake, alert, oriented x 3. In no apparent distress  Assessment & Plan        Anxiety Stable on alprazolam  for many years prior to establishing with me. No reported side effects. She is very upset regarding her telephone conversation with [facility] regarding alprazolam  refill. Have advised office manager of her displeasure.   -Refill alprazolam  prescription and send to [facility] on [address].   Carotid artery disease -  s/p endarterectomy. History CVA versus TIA - Remotely diagnosed at [facility] with amaurosis fugax MRI report from this time is not available, Unclear from medical record if this was actually a completed CVA  Hypertension Self-discontinued antihypertensive medication due to perceived low blood pressure and absence of edema. Reports blood pressure readings around 124/79. -Schedule in-office appointment to assess blood pressure and discuss potential need for antihypertensive medication.  Hyperlipidemia Patient has concerns about statin use due to family history of dementia and potential association. -Thoroughly educated regarding association of stroke risk with hypertension and hyperlipidemia and that her risk of stroke and vascular dementia far outweigh negligible risk of any cognitive side effects from statin  General Health Maintenance Overdue for basic exam and lab work, including A1c and cholesterol. -Schedule in-office appointment for physical exam and lab work.    No follow-ups on file.      Nancyann Perry, MD  [facility] [phone] (phone) [phone] (fax)  [facility]

## 2023-09-04 ENCOUNTER — Telehealth: Payer: BC Managed Care – PPO | Admitting: Family

## 2023-09-04 DIAGNOSIS — J069 Acute upper respiratory infection, unspecified: Secondary | ICD-10-CM | POA: Diagnosis not present

## 2023-09-04 MED ORDER — FLUTICASONE PROPIONATE 50 MCG/ACT NA SUSP: 2.0000 | Freq: Every day | NASAL | 6 refills | Status: DC

## 2023-09-04 MED ORDER — CETIRIZINE HCL 10 MG PO TABS: 10.0000 mg | ORAL_TABLET | Freq: Every day | ORAL | 1 refills | Status: DC

## 2023-09-04 NOTE — Progress Notes (Signed)

## 2023-10-31 IMAGING — CR DG KNEE COMPLETE 4+V*L*
1 series · 4 of 4 positions shown · non-contrast
Comparison: None Available.

CLINICAL DATA: Left knee pain and swelling. One month. No known
injury.

EXAM:
LEFT KNEE - COMPLETE 4+ VIEW

[Series 1: dg knee complete 4 views left · 0.14mm/px · 4 of 4 slices shown]
[im 1/4]
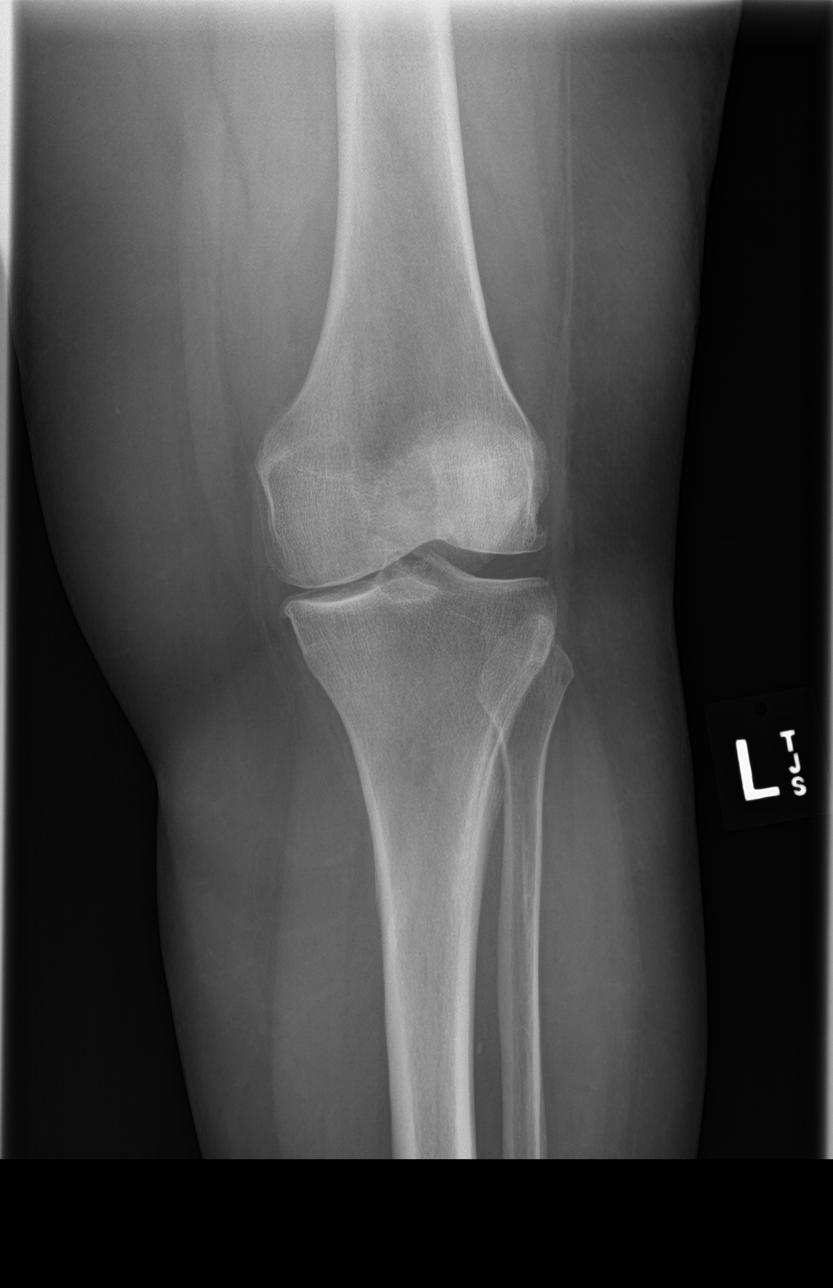
[im 2/4]
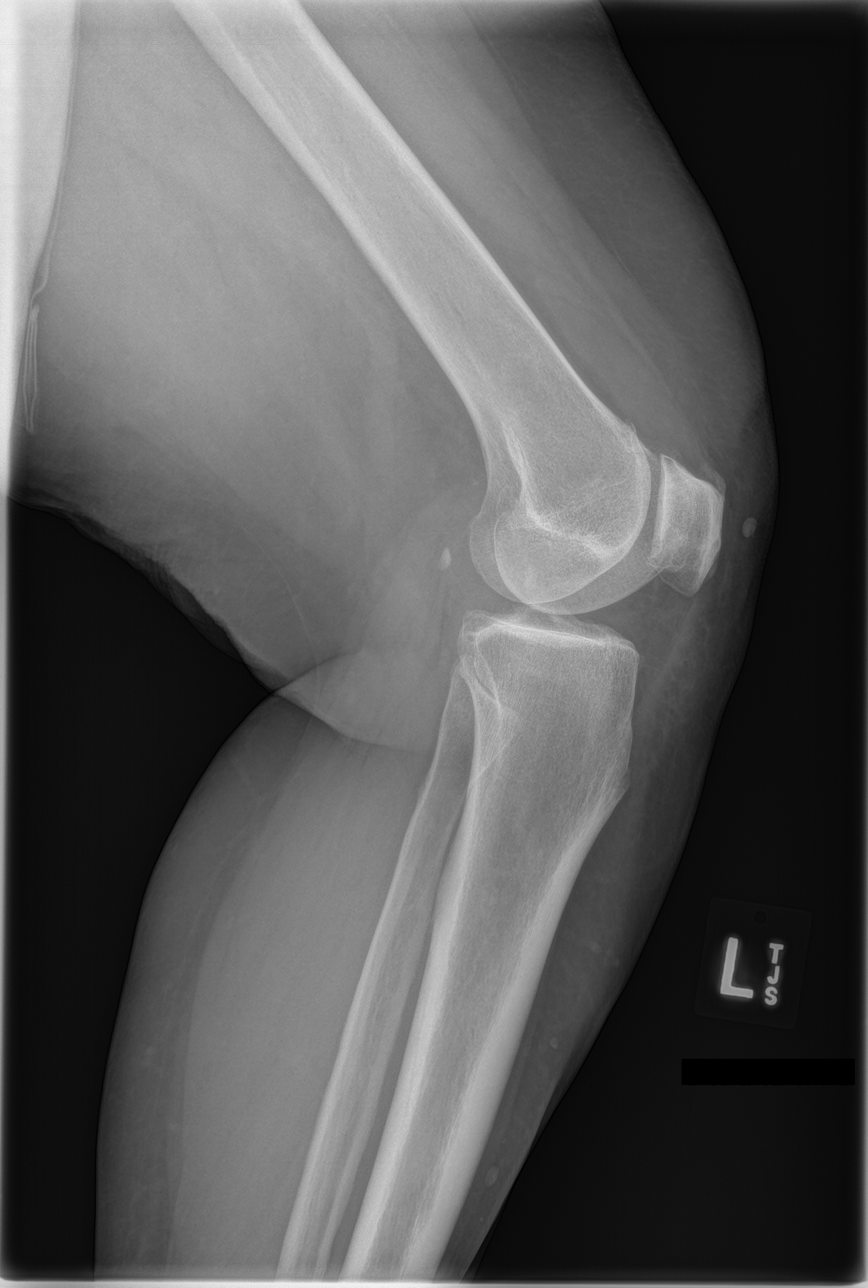
[im 3/4]
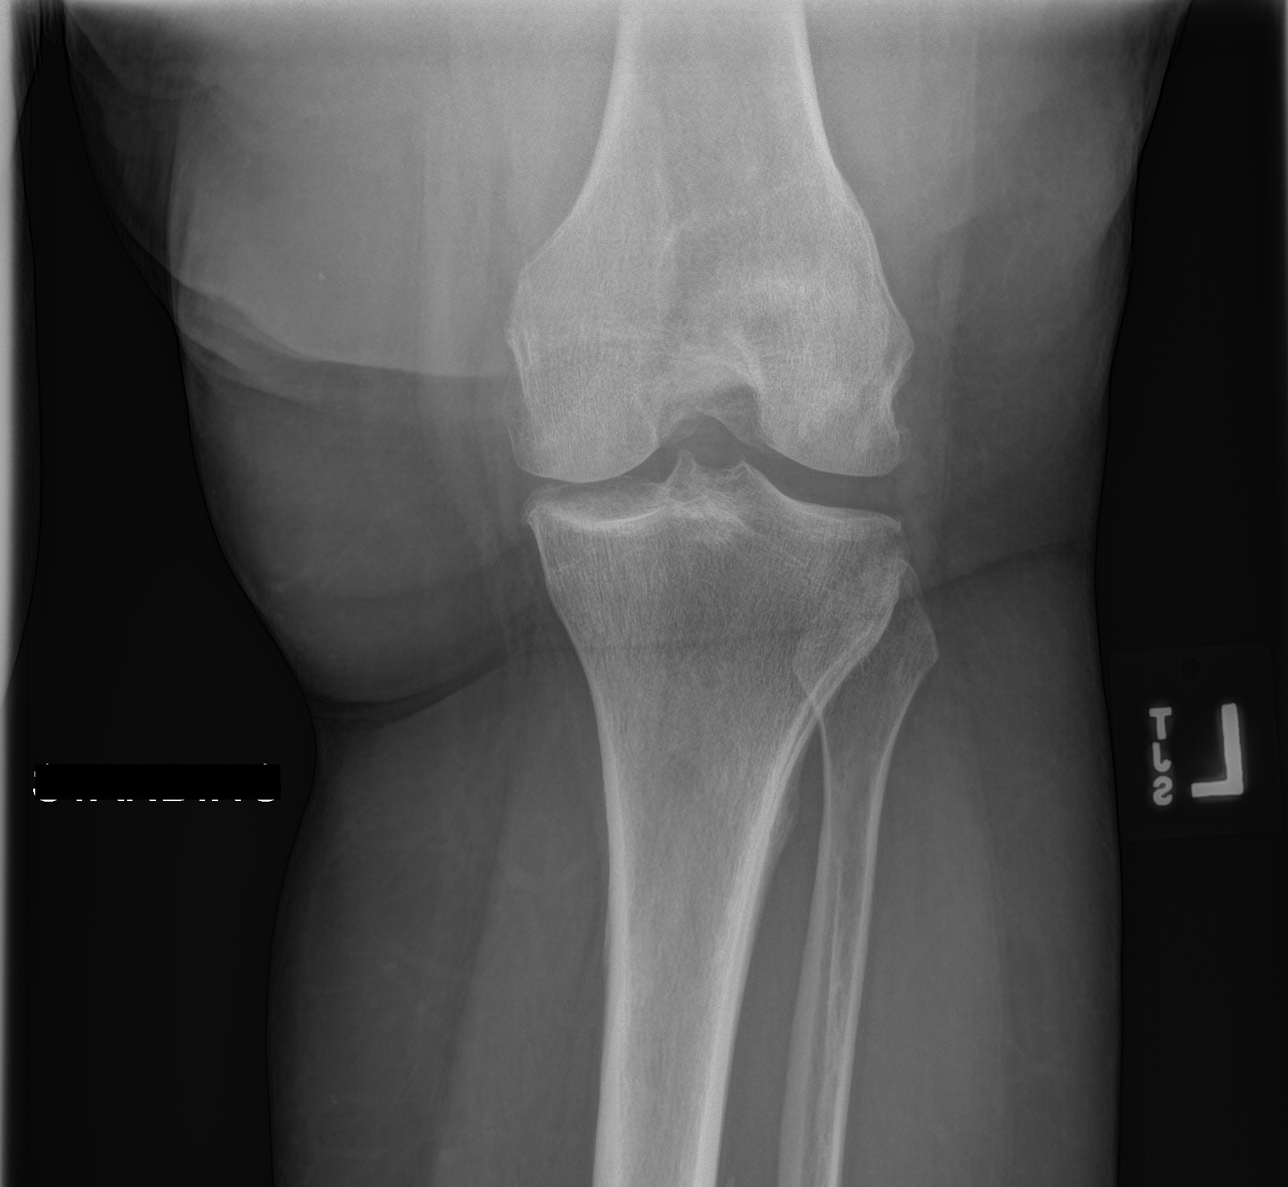
[im 4/4]
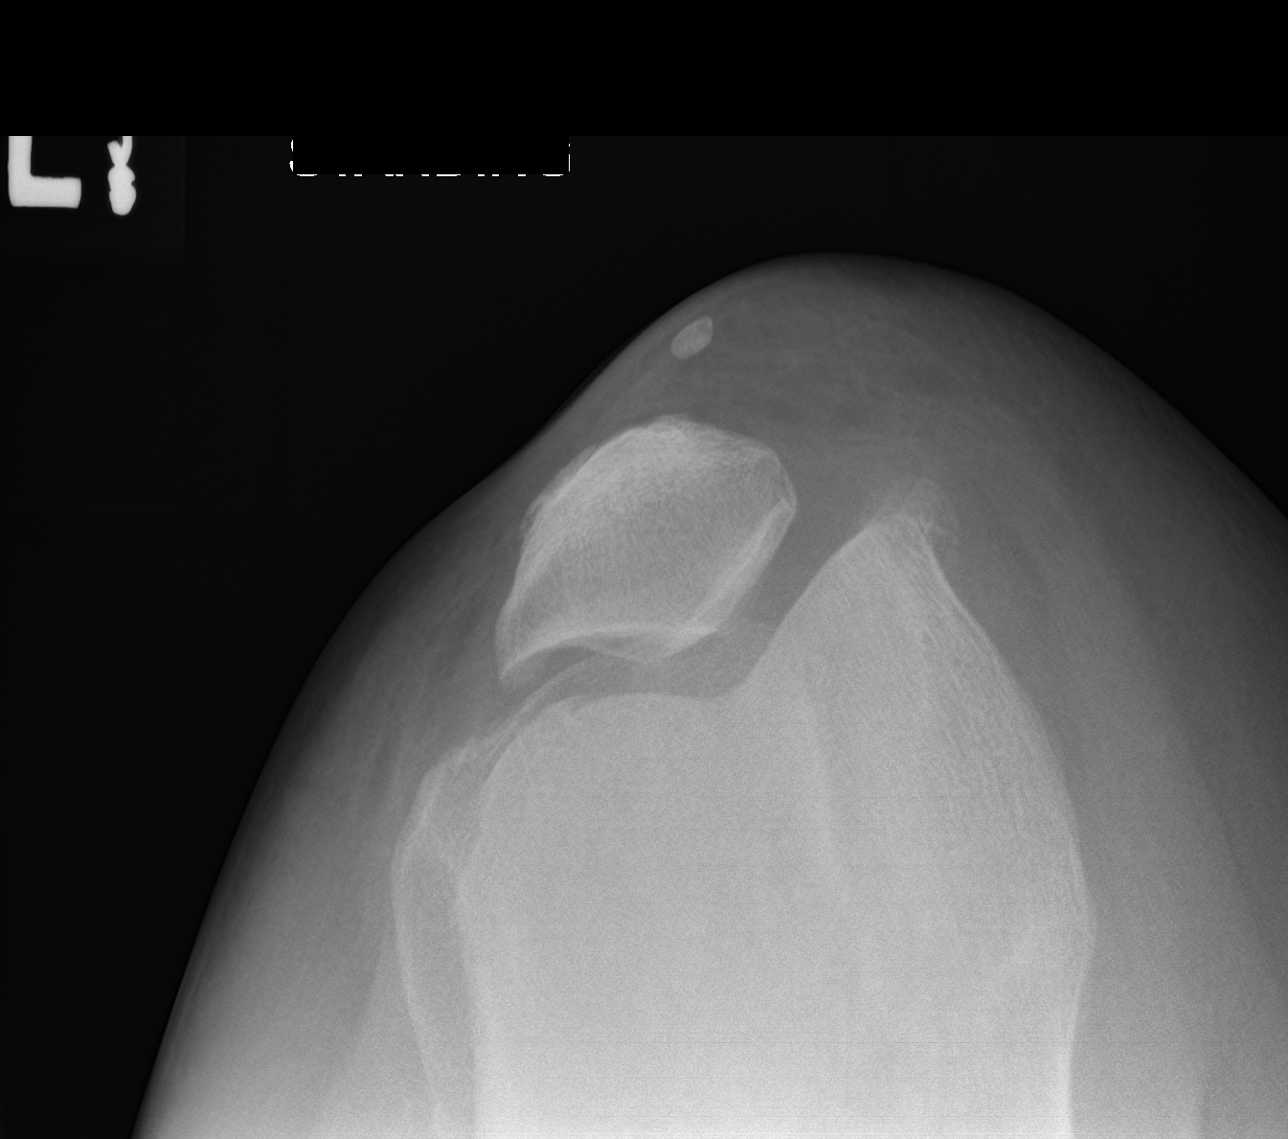

[4 of 4 positions shown; findings below may reference images not displayed]

FINDINGS: Mild medial compartment joint space narrowing and peripheral
osteophytosis. Mild to moderate peripheral patellar degenerative
osteophytosis. Moderate peripheral medial trochlear degenerative
osteophytosis. No joint effusion. Mild chronic spurring at the
quadriceps insertion on the patella. Well corticated chronic
approximate 6 mm ossicle within the soft tissues just anteromedial
to the patella. No acute fracture or dislocation.
IMPRESSION: Mild to moderate medial and patellofemoral compartment
osteoarthritis.

## 2023-11-04 ENCOUNTER — Encounter: Payer: Self-pay | Admitting: Internal Medicine

## 2023-11-04 ENCOUNTER — Other Ambulatory Visit: Payer: Self-pay

## 2023-11-04 ENCOUNTER — Ambulatory Visit (INDEPENDENT_AMBULATORY_CARE_PROVIDER_SITE_OTHER): Payer: BC Managed Care – PPO | Admitting: Internal Medicine

## 2023-11-04 ENCOUNTER — Emergency Department
Admission: EM | Admit: 2023-11-04 | Discharge: 2023-11-05 | Disposition: A | Discharge status: Home / Self Care | Attending: Emergency Medicine | Admitting: Emergency Medicine

## 2023-11-04 VITALS — BP 124/76 | HR 87 | Temp 98.1°F | Resp 16 | Ht 62.0 in | Wt 225.3 lb

## 2023-11-04 DIAGNOSIS — E7800 Pure hypercholesterolemia, unspecified: Secondary | ICD-10-CM

## 2023-11-04 DIAGNOSIS — I10 Essential (primary) hypertension: Secondary | ICD-10-CM | POA: Diagnosis not present

## 2023-11-04 DIAGNOSIS — R7303 Prediabetes: Secondary | ICD-10-CM | POA: Insufficient documentation

## 2023-11-04 DIAGNOSIS — R197 Diarrhea, unspecified: Secondary | ICD-10-CM | POA: Diagnosis not present

## 2023-11-04 DIAGNOSIS — R112 Nausea with vomiting, unspecified: Secondary | ICD-10-CM | POA: Insufficient documentation

## 2023-11-04 DIAGNOSIS — Z1211 Encounter for screening for malignant neoplasm of colon: Secondary | ICD-10-CM

## 2023-11-04 DIAGNOSIS — Z8639 Personal history of other endocrine, nutritional and metabolic disease: Secondary | ICD-10-CM

## 2023-11-04 DIAGNOSIS — F419 Anxiety disorder, unspecified: Secondary | ICD-10-CM

## 2023-11-04 DIAGNOSIS — Z1231 Encounter for screening mammogram for malignant neoplasm of breast: Secondary | ICD-10-CM

## 2023-11-04 DIAGNOSIS — E559 Vitamin D deficiency, unspecified: Secondary | ICD-10-CM | POA: Diagnosis not present

## 2023-11-04 DIAGNOSIS — Z8673 Personal history of transient ischemic attack (TIA), and cerebral infarction without residual deficits: Secondary | ICD-10-CM | POA: Insufficient documentation

## 2023-11-04 DIAGNOSIS — Z114 Encounter for screening for human immunodeficiency virus [HIV]: Secondary | ICD-10-CM

## 2023-11-04 MED ORDER — SODIUM CHLORIDE 0.9 % IV BOLUS
1000.0000 mL | Freq: Once | INTRAVENOUS | Status: AC
Start: 2023-11-04 — End: 2023-11-05
  Administered 2023-11-05: 1000 mL via INTRAVENOUS

## 2023-11-04 MED ORDER — ONDANSETRON HCL 4 MG/2ML IJ SOLN
4.0000 mg | Freq: Once | INTRAMUSCULAR | Status: AC
  Administered 2023-11-05: 4 mg via INTRAVENOUS
  Filled 2023-11-04: qty 2

## 2023-11-04 NOTE — Assessment & Plan Note (Signed)
 Recheck A1c

## 2023-11-04 NOTE — Assessment & Plan Note (Signed)
 Well controlled, takes an over the counter medication for stress but does not take anything else. Had been on xanax but weaned herself off.

## 2023-11-04 NOTE — Assessment & Plan Note (Signed)
 Recheck levels, currently taking supplements.

## 2023-11-04 NOTE — ED Triage Notes (Signed)
 Patient C/O nausea, vomiting, and diarrhea that began around 2100 this evening. Patient states her family has been sick as well. She received 4 of Zofran from EMS

## 2023-11-04 NOTE — Assessment & Plan Note (Signed)
 Recheck labs

## 2023-11-04 NOTE — Assessment & Plan Note (Signed)
 Not on a statin despite history of stent in left carotid and elevated cholesterol. Discussed statin risk vs benefit in depth, will recheck fasting labs and potentially start Repatha.

## 2023-11-04 NOTE — Progress Notes (Signed)
 New Patient Office Visit  Subjective    Patient ID: Rachael Gilmore, female    DOB: [birth year]  Age: 58 y.o. MRN: [id number]  CC:  Chief Complaint  Patient presents with   Establish Care    HPI LORITA FORINASH presents to establish care.  Hypertension: -Medications: Had been on Maxide 37.5-25 mg but no longer taking - weaned off but had swelling in the legs, resolved  -Denies any SOB, CP, vision changes, LE edema or symptoms of hypotension  HLD/Hx of CVA: -Medications: Had been Pravastatin 20 mg and was doing well but stopped it due to concerns about potential side effects (her mother has advanced dementia and had been on statins for years), could not tolerate Lipitor  -Last lipid panel: Lipid Panel     Component Value Date/Time   CHOL 253 (H) 06/15/2022 0932   TRIG 221 (H) 06/15/2022 0932   HDL 48 06/15/2022 0932   CHOLHDL 5.3 (H) 06/15/2022 0932   LDLCALC 164 (H) 06/15/2022 0932   LABVLDL 41 (H) 06/15/2022 0932  -Hx of CVA in 10/2008   Pre-Diabetes:  -Last A1c 6.1% in 2023 -Not on medications  Hyperparathyroidism: -Had been seen by ENT, did not require surgery -PTH 44 in 2021  Gout: -Last flare was in 2024 -Not on medications  Health Maintenance:  -Blood work due -Mammogram due - prefers [place] but willing to do mammogram every 2 years -Colon cancer screening due   Outpatient Encounter Medications as of 11/04/2023  Medication Sig   albuterol (VENTOLIN HFA) 108 (90 Base) MCG/ACT inhaler Inhale 1-2 puffs into the lungs every 6 (six) hours as needed for shortness of breath.   aspirin 325 MG tablet Take 1 tablet by mouth daily.   Cholecalciferol (VITAMIN D-3) 25 MCG (1000 UT) CAPS Take 5,000 Units by mouth daily.   Coenzyme Q10 (CO Q 10) 100 MG CAPS Take 1 capsule by mouth daily.   Multiple Vitamin (MULTI-VITAMINS) TABS Take 1 tablet by mouth daily.   nystatin ointment (MYCOSTATIN) Apply affected area twice daily as needed   pravastatin (PRAVACHOL) 40 MG tablet  Take 1 tablet (40 mg total) by mouth daily. (Patient not taking: Reported on 11/04/2023)   [DISCONTINUED] ALPRAZolam (XANAX) 0.5 MG tablet TAKE 1/2 TO 1 (ONE-HALF TO ONE) TABLET BY MOUTH TWICE DAILY AS NEEDED (Patient not taking: Reported on 11/04/2023)   [DISCONTINUED] amoxicillin-clavulanate (AUGMENTIN) 875-125 MG tablet Take 1 tablet by mouth 2 (two) times daily. (Patient not taking: Reported on 11/04/2023)   [DISCONTINUED] benzonatate (TESSALON) 100 MG capsule Take 1-2 caps PO TID PRN (Patient not taking: Reported on 11/04/2023)   [DISCONTINUED] cetirizine (ZYRTEC ALLERGY) 10 MG tablet Take 1 tablet (10 mg total) by mouth daily. (Patient not taking: Reported on 11/04/2023)   [DISCONTINUED] colchicine 0.6 MG tablet TAKE TWO TABLETS BY MOUTH THE FIRST DAY. THEN TAKE ONE TABLET BY MOUTH DAILY AS NEEDED (Patient not taking: Reported on 11/04/2023)   [DISCONTINUED] fluticasone (FLONASE) 50 MCG/ACT nasal spray Place 2 sprays into both nostrils daily. (Patient not taking: Reported on 11/04/2023)   [DISCONTINUED] indomethacin (INDOCIN) 50 MG capsule Take 1 capsule (50 mg total) by mouth 3 (three) times daily with meals. (Patient not taking: Reported on 11/04/2023)   [DISCONTINUED] triamterene-hydrochlorothiazide (MAXZIDE-25) 37.5-25 MG tablet Take 1 tablet by mouth daily. (Patient not taking: Reported on 11/04/2023)   No facility-administered encounter medications on file as of 11/04/2023.    Past Medical History:  Diagnosis Date   AF (amaurosis fugax) 04/16/2015   2010  Secondary to carotid artery disease.   Anxiety    Depression    Hyperlipidemia    Hyperparathyroidism (HCC) 06/11/2010   Recheck in 2020 normal.    Hypertension    Macromastia    Sleep apnea    not used CPAP in 6 yrs   Stroke [facility])    TIA (transient ischemic attack) 01/14/2016   amaurosis fugax. [facility]. MRI brain unremarkable   Tinea corporis 07/21/2015    Past Surgical History:  Procedure Laterality Date   BREAST REDUCTION SURGERY  Bilateral 11/01/2016   Procedure: BILATERAL BREAST REDUCTION WITH LIPOSUCTION;  Surgeon: Louisa Second, MD;  Location: [facility];  Service: Plastics;  Laterality: Bilateral;   BREAST SURGERY  07/2017   Revision of breast    CAROTID ENDARTERECTOMY Left 02/07/2009   [facility]   CESAREAN SECTION  1992 and 1994   KENALOG INJECTION Left 11/01/2016   Procedure: KENALOG INJECTION TO BILATERAL BREAST INCISIONS AND LEFT NECK SCAR;  Surgeon: Louisa Second, MD;  Location: [facility];  Service: Plastics;  Laterality: Left;   NECK SURGERY  07/26/2017   scar revision on left side of neck   TONSILLECTOMY  1969    Family History  Problem Relation Age of Onset   Albinism Son    Autism Son    Arrhythmia Mother    Breast cancer Sister     Social History   Socioeconomic History   Marital status: Legally Separated    Spouse name: Not on file   Number of children: 2   Years of education: 15   Highest education level: Not on file  Occupational History    Comment: 2 part time jobs, at [employer]. [employer], and [employer]  Tobacco Use   Smoking status: Former    Current packs/day: 0.00    Average packs/day: 1 pack/day for 18.0 years (18.0 ttl pk-yrs)    Types: Cigarettes    Start date: 03/09/1991    Quit date: 03/08/2009    Years since quitting: 14.6   Smokeless tobacco: Never  Vaping Use   Vaping status: Never Used  Substance and Sexual Activity   Alcohol use: Yes    Comment: socially   Drug use: No   Sexual activity: Not Currently  Other Topics Concern   Not on file  Social History Narrative   Not on file   Social Drivers of Health   Financial Resource Strain: Not on file  Food Insecurity: Not on file  Transportation Needs: Not on file  Physical Activity: Not on file  Stress: Not on file  Social Connections: Not on file  Intimate Partner Violence: Not on file    Review of Systems  All other systems reviewed and are negative.       Objective     Pulse 87   Temp 98.1 F (36.7 C) (Oral)   Resp 16   Ht 5\' 2"  (1.575 m)   Wt 225 lb 4.8 oz (102.2 kg)   SpO2 98%   BMI 41.21 kg/m   Physical Exam Constitutional:      Appearance: Normal appearance.  HENT:     Head: Normocephalic and atraumatic.     Mouth/Throat:     Mouth: Mucous membranes are moist.     Pharynx: Oropharynx is clear.  Eyes:     Extraocular Movements: Extraocular movements intact.     Conjunctiva/sclera: Conjunctivae normal.     Pupils: Pupils are equal, round, and reactive to light.  Neck:  Comments: No thyromegaly Cardiovascular:     Rate and Rhythm: Normal rate and regular rhythm.  Pulmonary:     Effort: Pulmonary effort is normal.     Breath sounds: Normal breath sounds.  Musculoskeletal:     Cervical back: No tenderness.     Right lower leg: No edema.     Left lower leg: No edema.  Lymphadenopathy:     Cervical: No cervical adenopathy.  Skin:    General: Skin is warm and dry.  Neurological:     General: No focal deficit present.     Mental Status: She is alert. Mental status is at baseline.  Psychiatric:        Mood and Affect: Mood normal.        Behavior: Behavior normal.         Assessment & Plan:  Pure hypercholesterolemia Assessment & Plan: Recheck lipid panel, not on medications currently.   Orders: -     Lipid panel  History of CVA (cerebrovascular accident) Assessment & Plan: Not on a statin despite history of stent in left carotid and elevated cholesterol. Discussed statin risk vs benefit in depth, will recheck fasting labs and potentially start Repatha.   Orders: -     Lipid panel  History of secondary hyperparathyroidism Assessment & Plan: Recheck labs.   Orders: -     COMPLETE METABOLIC PANEL WITHOUT GFR -     PTH, intact and calcium -     VITAMIN D 25 Hydroxy (Vit-D Deficiency, Fractures) -     TSH  Vitamin D deficiency Assessment & Plan: Recheck levels, currently taking supplements.   Orders: -      VITAMIN D 25 Hydroxy (Vit-D Deficiency, Fractures)  Prediabetes Assessment & Plan: Recheck A1c.   Orders: -     CBC with Differential/Platelet -     Hemoglobin A1c  Screening for HIV (human immunodeficiency virus) -     HIV Antibody (routine testing w rflx)  Colon cancer screening -     Cologuard  Encounter for screening mammogram for malignant neoplasm of breast -     3D Screening Mammogram, Left and Right; Future  Anxiety Assessment & Plan: Well controlled, takes an over the counter medication for stress but does not take anything else. Had been on xanax but weaned herself off.      Return for Pap whenever works for her but sometime this year.   Margarita Mail, DO

## 2023-11-04 NOTE — Assessment & Plan Note (Signed)
 Recheck lipid panel, not on medications currently.

## 2023-11-05 LAB — LIPID PANEL
Cholesterol: 264 mg/dL — ABNORMAL HIGH (ref <200)
HDL: 49 mg/dL — ABNORMAL LOW (ref >=50)
LDL Cholesterol (Calc): 185 mg/dL — ABNORMAL HIGH
Non-HDL Cholesterol (Calc): 215 mg/dL — ABNORMAL HIGH (ref <130)
Total CHOL/HDL Ratio: 5.4 (calc) — ABNORMAL HIGH (ref <5.0)
Triglycerides: 152 mg/dL — ABNORMAL HIGH (ref <150)

## 2023-11-05 LAB — CBC WITH DIFFERENTIAL/PLATELET
Abs Immature Granulocytes: 0.05 10*3/uL (ref 0.00–0.07)
Absolute Lymphocytes: 1854 {cells}/uL (ref 850–3900)
Absolute Monocytes: 324 {cells}/uL (ref 200–950)
Basophils Absolute: 0.1 10*3/uL (ref 0.0–0.1)
Basophils Absolute: 48 {cells}/uL (ref 0–200)
Basophils Relative: 0 %
Basophils Relative: 0.8 %
Eosinophils Absolute: 0.1 10*3/uL (ref 0.0–0.5)
Eosinophils Absolute: 198 {cells}/uL (ref 15–500)
Eosinophils Relative: 1 %
Eosinophils Relative: 3.3 %
HCT: 43.9 % (ref 35.0–45.0)
HCT: 45.4 % (ref 36.0–46.0)
Hemoglobin: 15.1 g/dL (ref 11.7–15.5)
Hemoglobin: 15.2 g/dL — ABNORMAL HIGH (ref 12.0–15.0)
Immature Granulocytes: 0 %
Lymphocytes Relative: 6 %
Lymphs Abs: 0.8 10*3/uL (ref 0.7–4.0)
MCH: 29.3 pg (ref 26.0–34.0)
MCH: 29.4 pg (ref 27.0–33.0)
MCHC: 33.5 g/dL (ref 30.0–36.0)
MCHC: 34.4 g/dL (ref 32.0–36.0)
MCV: 85.4 fL (ref 80.0–100.0)
MCV: 87.6 fL (ref 80.0–100.0)
MPV: 10.1 fL (ref 7.5–12.5)
Monocytes Absolute: 0.5 10*3/uL (ref 0.1–1.0)
Monocytes Relative: 4 %
Monocytes Relative: 5.4 %
Neutro Abs: 11.9 10*3/uL — ABNORMAL HIGH (ref 1.7–7.7)
Neutro Abs: 3576 {cells}/uL (ref 1500–7800)
Neutrophils Relative %: 59.6 %
Neutrophils Relative %: 89 %
Platelets: 210 10*3/uL (ref 150–400)
Platelets: 257 10*3/uL (ref 140–400)
RBC: 5.14 10*6/uL — ABNORMAL HIGH (ref 3.80–5.10)
RBC: 5.18 MIL/uL — ABNORMAL HIGH (ref 3.87–5.11)
RDW: 12.3 % (ref 11.5–15.5)
RDW: 12.6 % (ref 11.0–15.0)
Total Lymphocyte: 30.9 %
WBC: 13.4 10*3/uL — ABNORMAL HIGH (ref 4.0–10.5)
WBC: 6 10*3/uL (ref 3.8–10.8)
nRBC: 0 % (ref 0.0–0.2)

## 2023-11-05 LAB — COMPLETE METABOLIC PANEL WITHOUT GFR
AG Ratio: 2.3 (calc) (ref 1.0–2.5)
ALT: 12 U/L (ref 6–29)
AST: 15 U/L (ref 10–35)
Albumin: 5.1 g/dL (ref 3.6–5.1)
Alkaline phosphatase (APISO): 91 U/L (ref 37–153)
BUN: 16 mg/dL (ref 7–25)
CO2: 31 mmol/L (ref 20–32)
Calcium: 10.4 mg/dL (ref 8.6–10.4)
Chloride: 97 mmol/L — ABNORMAL LOW (ref 98–110)
Creat: 0.67 mg/dL (ref 0.50–1.05)
Globulin: 2.2 g/dL (ref 1.9–3.7)
Glucose, Bld: 125 mg/dL — ABNORMAL HIGH (ref 65–99)
Potassium: 4.4 mmol/L (ref 3.5–5.3)
Sodium: 138 mmol/L (ref 135–146)
Total Bilirubin: 0.7 mg/dL (ref 0.2–1.2)
Total Protein: 7.3 g/dL (ref 6.1–8.1)

## 2023-11-05 LAB — COMPREHENSIVE METABOLIC PANEL WITH GFR
ALT: 15 U/L (ref 0–44)
AST: 19 U/L (ref 15–41)
Albumin: 4.4 g/dL (ref 3.5–5.0)
Alkaline Phosphatase: 77 U/L (ref 38–126)
Anion gap: 13 (ref 5–15)
BUN: 22 mg/dL — ABNORMAL HIGH (ref 6–20)
CO2: 24 mmol/L (ref 22–32)
Calcium: 9.4 mg/dL (ref 8.9–10.3)
Chloride: 97 mmol/L — ABNORMAL LOW (ref 98–111)
Creatinine, Ser: 0.75 mg/dL (ref 0.44–1.00)
GFR, Estimated: >60 mL/min (ref >60)
Glucose, Bld: 149 mg/dL — ABNORMAL HIGH (ref 70–99)
Potassium: 3.8 mmol/L (ref 3.5–5.1)
Sodium: 134 mmol/L — ABNORMAL LOW (ref 135–145)
Total Bilirubin: 1.2 mg/dL (ref 0.0–1.2)
Total Protein: 7.3 g/dL (ref 6.5–8.1)

## 2023-11-05 LAB — HEMOGLOBIN A1C
Hgb A1c MFr Bld: 6.4 %{Hb} — ABNORMAL HIGH (ref <5.7)
Mean Plasma Glucose: 137 mg/dL
eAG (mmol/L): 7.6 mmol/L

## 2023-11-05 LAB — PTH, INTACT AND CALCIUM
Calcium: 10.4 mg/dL (ref 8.6–10.4)
PTH: 42 pg/mL (ref 16–77)

## 2023-11-05 LAB — LIPASE, BLOOD: Lipase: 39 U/L (ref 11–51)

## 2023-11-05 LAB — TSH: TSH: 1.24 m[IU]/L (ref 0.40–4.50)

## 2023-11-05 LAB — HIV ANTIBODY (ROUTINE TESTING W REFLEX): HIV 1&2 Ab, 4th Generation: NONREACTIVE

## 2023-11-05 LAB — VITAMIN D 25 HYDROXY (VIT D DEFICIENCY, FRACTURES): Vit D, 25-Hydroxy: 69 ng/mL (ref 30–100)

## 2023-11-05 MED ORDER — ONDANSETRON 4 MG PO TBDP
4.0000 mg | ORAL_TABLET | Freq: Three times a day (TID) | ORAL | 0 refills | Status: AC | PRN
Start: 2023-11-05 — End: ?

## 2023-11-05 NOTE — Discharge Instructions (Addendum)
 Take Zofran as needed for nausea and vomiting. Find an antidiarrheal medication like loperamide/Imodium over-the-counter that can help with your diarrhea.  Drink plenty of fluids to stay well-hydrated.  Find Pedialyte or similar electrolyte rehydration formulas at your local pharmacy.  Thank you for choosing [place] for your health care today!  Please see your primary doctor this week for a follow up appointment.   If you have any new, worsening, or unexpected symptoms call your doctor right away or come back to the emergency department for reevaluation.  It was my pleasure to care for you today.   Daneil Dan Modesto Charon, MD

## 2023-11-05 NOTE — ED Provider Notes (Addendum)
 [facility] Provider Note    Event Date/Time   First MD Initiated Contact with Patient 11/04/23 2340     (approximate)   History   Emesis and Diarrhea   HPI  Rachael Gilmore is a 58 y.o. female   Past medical history of hypertension hyperlipidemia, previous stroke, anxiety depression who presents emerged apartment with nausea vomiting and diarrhea for the last several hours.  Acute onset around 9 PM with known sick contacts in the family with similar GI issues with nausea vomiting diarrhea.  She has had no GI bleeding.  She denies abdominal pain.  No urinary symptoms.  No fevers chills.  She denies international travel recently.  No recent antibiotic use.  During her bout of illness tonight she had an episode where she felt warm flushed sensation, lightheadedness, and felt "my blood pressure dropped" and measured her blood pressure at 117 systolic.  She did not pass out or suffer any injuries from a fall.   External Medical Documents Reviewed: Outpatient appointment earlier today to establish care with a primary doctor, reviewed past medical history medications      Physical Exam   Triage Vital Signs: ED Triage Vitals  Encounter Vitals Group     BP 11/04/23 2341 128/74     Systolic BP Percentile --      Diastolic BP Percentile --      Pulse Rate 11/04/23 2341 92     Resp 11/04/23 2341 18     Temp 11/04/23 2341 98.9 F (37.2 C)     Temp Source 11/04/23 2341 Oral     SpO2 11/04/23 2341 100 %     Weight 11/04/23 2340 225 lb (102.1 kg)     Height 11/04/23 2340 5\' 2"  (1.575 m)     Head Circumference --      Peak Flow --      Pain Score --      Pain Loc --      Pain Education --      Exclude from Growth Chart --     Most recent vital signs: Vitals:   11/04/23 2341  BP: 128/74  Pulse: 92  Resp: 18  Temp: 98.9 F (37.2 C)  SpO2: 100%    General: Awake, no distress.  CV:  Good peripheral perfusion.  Resp:  Normal effort.  Abd:  No  distention.  Other:  Awake alert comfortable pleasant woman in no acute distress with normal vital signs and afebrile.  She has a soft nontender abdomen deep palpation all quadrants without rigidity or guarding.  She appears euvolemic overall.   ED Results / Procedures / Treatments   Labs (all labs ordered are listed, but only abnormal results are displayed) Labs Reviewed  COMPREHENSIVE METABOLIC PANEL WITH GFR - Abnormal; Notable for the following components:      Result Value   Sodium 134 (*)    Chloride 97 (*)    Glucose, Bld 149 (*)    BUN 22 (*)    All other components within normal limits  CBC WITH DIFFERENTIAL/PLATELET - Abnormal; Notable for the following components:   WBC 13.4 (*)    RBC 5.18 (*)    Hemoglobin 15.2 (*)    Neutro Abs 11.9 (*)    All other components within normal limits  LIPASE, BLOOD     I ordered and reviewed the above labs they are notable for leukocytosis     PROCEDURES:  Critical Care performed: No  Procedures  MEDICATIONS ORDERED IN ED: Medications  sodium chloride 0.9 % bolus 1,000 mL (1,000 mLs Intravenous New Bag/Given 11/05/23 0028)  ondansetron (ZOFRAN) injection 4 mg (4 mg Intravenous Given 11/05/23 0028)    IMPRESSION / MDM / ASSESSMENT AND PLAN / ED COURSE  I reviewed the triage vital signs and the nursing notes.                                Patient's presentation is most consistent with acute presentation with potential threat to life or bodily function.  Differential diagnosis includes, but is not limited to, viral gastroenteritis, dehydration electrolyte disturbance, considered but less likely intra-abdominal infections that would require surgery like appendicitis or cholecystitis, ruptured viscus   The patient is on the cardiac monitor to evaluate for evidence of arrhythmia and/or significant heart rate changes.  MDM:    This is a patient with nausea vomiting diarrhea likely got from her grandchildren who have  similar symptoms.  She denies abdominal pain is a soft benign abdominal exam so I doubt surgical abdominal pathologies at this time.  Very short course of illness only several hours that have gotten better with some Zofran administered by EMS already.  Had vasovagal reaction.   Will give fluids, antiemetic, check electrolytes basic labs.   If no marked abnormalities I think she is stable enough to be discharged and managed as outpatient follow-up with her primary doctor.   I will give her prescription for antiemetic.         FINAL CLINICAL IMPRESSION(S) / ED DIAGNOSES   Final diagnoses:  Nausea vomiting and diarrhea     Rx / DC Orders   ED Discharge Orders          Ordered    ondansetron (ZOFRAN-ODT) 4 MG disintegrating tablet  Every 8 hours PRN        11/05/23 0053             Note:  This document was prepared using Dragon voice recognition software and may include unintentional dictation errors.    Pilar Jarvis, MD 11/05/23 Lenice Pressman    Pilar Jarvis, MD 11/05/23 [phone]

## 2023-11-10 ENCOUNTER — Encounter: Payer: Self-pay | Admitting: Internal Medicine

## 2024-07-27 ENCOUNTER — Emergency Department (HOSPITAL_COMMUNITY)

## 2024-07-27 ENCOUNTER — Other Ambulatory Visit: Payer: Self-pay

## 2024-07-27 ENCOUNTER — Encounter (HOSPITAL_COMMUNITY): Payer: Self-pay | Admitting: Emergency Medicine

## 2024-07-27 ENCOUNTER — Emergency Department (HOSPITAL_COMMUNITY)
Admission: EM | Admit: 2024-07-27 | Discharge: 2024-07-27 | Disposition: A | Discharge status: Home / Self Care | Attending: Emergency Medicine | Admitting: Emergency Medicine

## 2024-07-27 DIAGNOSIS — Y9389 Activity, other specified: Secondary | ICD-10-CM | POA: Insufficient documentation

## 2024-07-27 DIAGNOSIS — M79641 Pain in right hand: Secondary | ICD-10-CM | POA: Insufficient documentation

## 2024-07-27 DIAGNOSIS — Z7982 Long term (current) use of aspirin: Secondary | ICD-10-CM | POA: Insufficient documentation

## 2024-07-27 DIAGNOSIS — M25561 Pain in right knee: Secondary | ICD-10-CM | POA: Insufficient documentation

## 2024-07-27 DIAGNOSIS — I700 Atherosclerosis of aorta: Secondary | ICD-10-CM | POA: Diagnosis not present

## 2024-07-27 DIAGNOSIS — I10 Essential (primary) hypertension: Secondary | ICD-10-CM | POA: Insufficient documentation

## 2024-07-27 DIAGNOSIS — M51369 Other intervertebral disc degeneration, lumbar region without mention of lumbar back pain or lower extremity pain: Secondary | ICD-10-CM | POA: Diagnosis not present

## 2024-07-27 DIAGNOSIS — M25562 Pain in left knee: Secondary | ICD-10-CM | POA: Insufficient documentation

## 2024-07-27 DIAGNOSIS — W108XXA Fall (on) (from) other stairs and steps, initial encounter: Secondary | ICD-10-CM | POA: Diagnosis not present

## 2024-07-27 DIAGNOSIS — M79642 Pain in left hand: Secondary | ICD-10-CM | POA: Diagnosis not present

## 2024-07-27 DIAGNOSIS — S0011XA Contusion of right eyelid and periocular area, initial encounter: Secondary | ICD-10-CM | POA: Diagnosis not present

## 2024-07-27 DIAGNOSIS — S3992XA Unspecified injury of lower back, initial encounter: Secondary | ICD-10-CM | POA: Diagnosis not present

## 2024-07-27 DIAGNOSIS — S022XXA Fracture of nasal bones, initial encounter for closed fracture: Secondary | ICD-10-CM | POA: Diagnosis not present

## 2024-07-27 DIAGNOSIS — M25532 Pain in left wrist: Secondary | ICD-10-CM | POA: Insufficient documentation

## 2024-07-27 DIAGNOSIS — K5730 Diverticulosis of large intestine without perforation or abscess without bleeding: Secondary | ICD-10-CM | POA: Diagnosis not present

## 2024-07-27 DIAGNOSIS — S02831A Fracture of medial orbital wall, right side, initial encounter for closed fracture: Secondary | ICD-10-CM | POA: Diagnosis not present

## 2024-07-27 DIAGNOSIS — S6992XA Unspecified injury of left wrist, hand and finger(s), initial encounter: Secondary | ICD-10-CM | POA: Diagnosis not present

## 2024-07-27 DIAGNOSIS — S0240CA Maxillary fracture, right side, initial encounter for closed fracture: Secondary | ICD-10-CM | POA: Diagnosis not present

## 2024-07-27 DIAGNOSIS — S0231XA Fracture of orbital floor, right side, initial encounter for closed fracture: Secondary | ICD-10-CM | POA: Insufficient documentation

## 2024-07-27 DIAGNOSIS — S0285XA Fracture of orbit, unspecified, initial encounter for closed fracture: Secondary | ICD-10-CM

## 2024-07-27 DIAGNOSIS — Z043 Encounter for examination and observation following other accident: Secondary | ICD-10-CM | POA: Diagnosis not present

## 2024-07-27 DIAGNOSIS — M25531 Pain in right wrist: Secondary | ICD-10-CM | POA: Insufficient documentation

## 2024-07-27 DIAGNOSIS — M47812 Spondylosis without myelopathy or radiculopathy, cervical region: Secondary | ICD-10-CM | POA: Diagnosis not present

## 2024-07-27 DIAGNOSIS — R220 Localized swelling, mass and lump, head: Secondary | ICD-10-CM | POA: Diagnosis not present

## 2024-07-27 DIAGNOSIS — M542 Cervicalgia: Secondary | ICD-10-CM | POA: Diagnosis not present

## 2024-07-27 DIAGNOSIS — S0990XA Unspecified injury of head, initial encounter: Secondary | ICD-10-CM | POA: Diagnosis not present

## 2024-07-27 DIAGNOSIS — W19XXXA Unspecified fall, initial encounter: Secondary | ICD-10-CM

## 2024-07-27 MED ORDER — ONDANSETRON HCL 4 MG/2ML IJ SOLN
4.0000 mg | Freq: Once | INTRAMUSCULAR | Status: AC | PRN
  Administered 2024-07-27: 4 mg via INTRAVENOUS
  Filled 2024-07-27: qty 2

## 2024-07-27 MED ORDER — ACETAMINOPHEN 500 MG PO TABS
1000.0000 mg | ORAL_TABLET | Freq: Once | ORAL | Status: AC
  Administered 2024-07-27: 1000 mg via ORAL
  Filled 2024-07-27: qty 2

## 2024-07-27 MED ORDER — OXYCODONE-ACETAMINOPHEN 5-325 MG PO TABS: 1.0000 | ORAL_TABLET | Freq: Four times a day (QID) | ORAL | 0 refills | Status: AC | PRN

## 2024-07-27 MED ORDER — MORPHINE SULFATE (PF) 4 MG/ML IV SOLN
4.0000 mg | Freq: Once | INTRAVENOUS | Status: AC
  Administered 2024-07-27: 4 mg via INTRAVENOUS
  Filled 2024-07-27: qty 1

## 2024-07-27 NOTE — Discharge Instructions (Addendum)
 As we discussed, your injuries include a fracture of your right orbital wall, fracture of the right maxillary sinus and fracture of nasal bone.  Do not use straws, blow your nose until seen by ENT.  Please follow-up with Dr. Greggory, facial trauma, at your earliest convenience. You may take ibuprofen at home for pain.  If this does not control pain, please take 1 tablet of oxycodone .  Do not drive or operate machinery while taking oxycodone .  Please be aware this can cause constipation.  Please be aware this will make you lightheaded and drowsy.  Please return to the ED with new symptoms.

## 2024-07-27 NOTE — ED Provider Notes (Signed)
 " [facility] Provider Note   CSN: [id number] Arrival date & time: 07/27/24  0207     Patient presents with: Fall, Bilateral knee pain, Bilateral wrist pain, and Neck Pain   Rachael Gilmore is a 59 y.o. female with history of TIA, anxiety, depression, hypertension, CVA.  Presents to ED for fall.  Reports that she was taking her dog out, standing at the top of the stairs when sheTripped and fell down 10 stairs landing on concrete pad.  Reports she hit her face on concrete.  Denies LOC.  Denies blood thinners.  Reports to ED complaining of pain in bilateral wrists, bilateral knees, bilateral hands, facial pain, headache and neck pain.  Denies chest pain, shortness of breath preceding injury.  Endorsing low back pain.  States she lives at home alone.   Fall  Neck Pain      Prior to Admission medications  Medication Sig Start Date End Date Taking? Authorizing Provider  oxyCODONE -acetaminophen  (PERCOCET/ROXICET) 5-325 MG tablet Take 1 tablet by mouth every 6 (six) hours as needed for severe pain (pain score 7-10). 07/27/24  Yes Ruthell Lonni FALCON, PA-C  albuterol  (VENTOLIN  HFA) 108 (90 Base) MCG/ACT inhaler Inhale 1-2 puffs into the lungs every 6 (six) hours as needed for shortness of breath. 09/04/22   Mayers, Cari S, PA-C  aspirin 325 MG tablet Take 1 tablet by mouth daily. 03/24/09   [provider]  Cholecalciferol (VITAMIN D -3) 25 MCG (1000 UT) CAPS Take 5,000 Units by mouth daily.    [provider]  Coenzyme Q10 (CO Q 10) 100 MG CAPS Take 1 capsule by mouth daily.    [provider]  Multiple Vitamin (MULTI-VITAMINS) TABS Take 1 tablet by mouth daily.    [provider]  nystatin  ointment (MYCOSTATIN ) Apply affected area twice daily as needed 08/16/17   Gasper Nancyann BRAVO, MD  ondansetron  (ZOFRAN -ODT) 4 MG disintegrating tablet Take 1 tablet (4 mg total) by mouth every 8 (eight) hours as needed for nausea or  vomiting. 11/05/23   Cyrena Mylar, MD  pravastatin  (PRAVACHOL ) 40 MG tablet Take 1 tablet (40 mg total) by mouth daily. Patient not taking: Reported on 11/04/2023 06/15/22   Gasper Nancyann BRAVO, MD    Allergies: Zoster vac recomb adjuvanted; Zoster vaccine recombinant , adjuvanted; Amoxicillin -pot clavulanate; Codeine ; and Atorvastatin    Review of Systems  Musculoskeletal:  Positive for neck pain.  All other systems reviewed and are negative.   Updated Vital Signs BP 138/73   Pulse 69   Temp 97.9 F (36.6 C) (Oral)   Resp 14   Ht 5' 2 (1.575 m)   Wt 97.5 kg   SpO2 96%   BMI 39.32 kg/m   Physical Exam Vitals and nursing note reviewed.  Constitutional:      General: She is not in acute distress.    Appearance: She is well-developed.  HENT:     Head: Normocephalic.     Comments: Extensive abrasions to right side of face.  Ecchymosis, swelling to right orbit.    Nose:     Comments: Tenderness to nasal bridge Eyes:     Extraocular Movements: Extraocular movements intact.     Conjunctiva/sclera: Conjunctivae normal.     Comments: Ecchymosis, swelling to right orbit.  EOMs intact, nonpainful.  Neck:     Comments: Tenderness cervical spine, arrives in c-collar Cardiovascular:     Rate and Rhythm: Normal rate and regular rhythm.     Heart  sounds: No murmur heard. Pulmonary:     Effort: Pulmonary effort is normal. No respiratory distress.     Breath sounds: Normal breath sounds.  Abdominal:     Palpations: Abdomen is soft.     Tenderness: There is no abdominal tenderness.  Musculoskeletal:        General: No swelling.     Cervical back: Neck supple. Tenderness present.     Comments: Reduced ROM bilateral wrist secondary to pain.  No deformity.  Reduced ROM bilateral knee secondary to pain.  No obvious deformity.  Intact range of motion bilateral shoulders.  Intact range of motion bilateral elbows.  Intact range of motion bilateral hips.  Intact range of motion bilateral ankles.   No deformities noted.  Skin:    General: Skin is warm and dry.     Capillary Refill: Capillary refill takes less than 2 seconds.  Neurological:     Mental Status: She is alert and oriented to person, place, and time. Mental status is at baseline.     Comments: Reassuring neurological examination, no focal neurodeficits  Psychiatric:        Mood and Affect: Mood normal.     (all labs ordered are listed, but only abnormal results are displayed) Labs Reviewed - No data to display  EKG: None  Radiology: CT Lumbar Spine Wo Contrast Result Date: 07/27/2024 CLINICAL DATA:  Initial evaluation for acute trauma. EXAM: CT LUMBAR SPINE WITHOUT CONTRAST TECHNIQUE: Multidetector CT imaging of the lumbar spine was performed without intravenous contrast administration. Multiplanar CT image reconstructions were also generated. RADIATION DOSE REDUCTION: This exam was performed according to the departmental dose-optimization program which includes automated exposure control, adjustment of the mA and/or kV according to patient size and/or use of iterative reconstruction technique. COMPARISON:  None Available. FINDINGS: Segmentation: Standard. Lowest well-formed disc space labeled the L5-S1 level. Alignment: Mild exaggeration of the normal lumbar lordosis. No significant listhesis. Trace dextroscoliosis. Vertebrae: Vertebral body height maintained without acute or chronic fracture. Visualized sacrum and pelvis intact. No worrisome osseous lesions. Paraspinal and other soft tissues: Paraspinous soft tissues demonstrate no acute finding. Mild aortic atherosclerosis. Colonic diverticulosis noted. Disc levels: Mild for age degenerative disc disease. Moderate lower lumbar facet hypertrophy at L3-4 through L5-S1. IMPRESSION: 1. No acute osseous injury within the lumbar spine. 2. Mild for age degenerative disc disease with moderate lower lumbar facet hypertrophy. 3. Colonic diverticulosis. Aortic Atherosclerosis  (ICD10-I70.0). Electronically Signed   By: Morene Hoard M.D.   On: 07/27/2024 03:35   CT Maxillofacial Wo Contrast Result Date: 07/27/2024 EXAM: CT OF THE FACE WITHOUT CONTRAST 07/27/2024 03:01:20 AM TECHNIQUE: CT of the face was performed without the administration of intravenous contrast. Multiplanar reformatted images are provided for review. Automated exposure control, iterative reconstruction, and/or weight based adjustment of the mA/kV was utilized to reduce the radiation dose to as low as reasonably achievable. COMPARISON: None available. CLINICAL HISTORY: Recent fall with facial pain. FINDINGS: FACIAL BONES: Mildly displaced right nasal bone fracture is noted. No acute mandibular fracture is noted. No mandibular dislocation. No suspicious bone lesion. ORBITS: Depressed fracture of the right inferior orbital wall. Globes are intact. No acute traumatic injury. No inflammatory change. SINUSES AND MASTOIDS: A mildly impacted fracture of the anterior wall of the right maxillary antrum is noted. Blood is noted within the right maxillary sinus related to the fracture. SOFT TISSUES: No acute abnormality. IMPRESSION: 1. Depressed fracture of the right inferior orbital wall without evidence of extraocular muscle entrapment. 2. Mildly  impacted fracture of the anterior wall of the right maxillary antrum with associated blood within the right maxillary sinus. 3. Mildly displaced right nasal bone fracture. Electronically signed by: Oneil Devonshire MD 07/27/2024 03:23 AM EST RP Workstation: [id number]   CT Cervical Spine Wo Contrast Result Date: 07/27/2024 EXAM: CT CERVICAL SPINE WITHOUT CONTRAST 07/27/2024 03:01:20 AM TECHNIQUE: CT of the cervical spine was performed without the administration of intravenous contrast. Multiplanar reformatted images are provided for review. Automated exposure control, iterative reconstruction, and/or weight based adjustment of the mA/kV was utilized to reduce the radiation dose  to as low as reasonably achievable. COMPARISON: None available. CLINICAL HISTORY: Neck pain following fall FINDINGS: BONES AND ALIGNMENT: Straightening of the normal cervical lordosis is noted. Seven cervical segments are well visualized. Vertebral body height is well maintained. No acute fracture or acute facet abnormality is noted. DEGENERATIVE CHANGES: Very mild osteophytic changes are seen. No significant facet hypertrophic changes are noted. SOFT TISSUES: The surrounding soft tissue structures are within normal limits. No prevertebral soft tissue swelling. LUNG APICES: The visualized lung apices are unremarkable. IMPRESSION: 1. Degenerative change without acute abnormality. 2. Straightening of the normal cervical lordosis. Electronically signed by: Oneil Devonshire MD 07/27/2024 03:20 AM EST RP Workstation: [id number]   CT Head Wo Contrast Result Date: 07/27/2024 EXAM: CT HEAD WITHOUT CONTRAST 07/27/2024 03:01:20 AM TECHNIQUE: CT of the head was performed without the administration of intravenous contrast. Automated exposure control, iterative reconstruction, and/or weight based adjustment of the mA/kV was utilized to reduce the radiation dose to as low as reasonably achievable. COMPARISON: None available. CLINICAL HISTORY: Fall FINDINGS: BRAIN AND VENTRICLES: No acute hemorrhage. No evidence of acute infarct. No extra-axial collection. No mass effect or midline shift. Calcification is noted in the left basal ganglia. ORBITS: Mildly depressed right orbital floor fracture. Soft tissue swelling over the right orbit. SINUSES: Blood within the right maxillary sinus. Blood within the right ethmoid air cells and right frontal sinus. SOFT TISSUES AND SKULL: No other fracture is noted. IMPRESSION: 1. Mildly depressed right orbital floor fracture with associated soft tissue swelling over the right orbit. 2. Right-sided hemosinus. 3. No acute intracranial abnormality noted Electronically signed by: Oneil Devonshire MD  07/27/2024 03:19 AM EST RP Workstation: [id number]   DG Knee Left Port Result Date: 07/27/2024 EXAM: 1 OR 2 VIEW(S) XRAY OF THE KNEE 07/27/2024 02:55:00 AM COMPARISON: 01/06/2022 CLINICAL HISTORY: [id number] Fall [id number] FINDINGS: BONES AND JOINTS: No acute fracture. No significant joint effusion. Marginal spurs about all 3 compartments. Moderate narrowing of the articular cartilage in the medial compartment. SOFT TISSUES: The soft tissues are unremarkable. IMPRESSION: 1. Tricompartmental degenerative changes without acute abnormality. Electronically signed by: Oneil Devonshire MD 07/27/2024 03:10 AM EST RP Workstation: [id number]   DG Hand Complete Left Result Date: 07/27/2024 EXAM: 3 OR MORE VIEW(S) XRAY OF THE LEFT HAND 07/27/2024 02:54:00 AM COMPARISON: None available. CLINICAL HISTORY: sp fall down 10 stairs FINDINGS: BONES AND JOINTS: No acute fracture. No malalignment. SOFT TISSUES: The soft tissues are unremarkable. IMPRESSION: 1. No acute fracture or dislocation. Electronically signed by: Oneil Devonshire MD 07/27/2024 03:09 AM EST RP Workstation: [id number]   DG Knee Right Port Result Date: 07/27/2024 EXAM: 2 VIEW(S) XRAY OF THE KNEE 07/27/2024 02:55:00 AM COMPARISON: None available. CLINICAL HISTORY: 809823 Fall 190176 FINDINGS: BONES AND JOINTS: No acute fracture. No significant joint effusion. Tricompartmental degenerative changes are noted. SOFT TISSUES: The soft tissues are unremarkable. IMPRESSION: 1. Degenerative changes without acute abnormality. Electronically signed by: Oneil Devonshire MD 07/27/2024 03:09  AM EST RP Workstation: [id number]   DG Wrist Complete Right Result Date: 07/27/2024 EXAM: 3 or more VIEW(S) XRAY OF THE  WRIST COMPARISON: None available. CLINICAL HISTORY: Fall FINDINGS: BONES AND JOINTS: No acute fracture. No malalignment. SOFT TISSUES: The soft tissues are unremarkable. IMPRESSION: 1. No acute fracture or dislocation. Electronically signed by: Oneil Devonshire MD 07/27/2024 03:08 AM  EST RP Workstation: [id number]   DG Wrist Complete Left Result Date: 07/27/2024 EXAM: 3 OR MORE VIEW(S) XRAY OF THE LEFT WRIST 07/27/2024 02:55:00 AM COMPARISON: None available. CLINICAL HISTORY: Fall FINDINGS: BONES AND JOINTS: No acute fracture. No malalignment. SOFT TISSUES: The soft tissues are unremarkable. IMPRESSION: 1. No acute fracture or dislocation. Electronically signed by: Oneil Devonshire MD 07/27/2024 03:07 AM EST RP Workstation: [id number]   DG Hand Complete Right Result Date: 07/27/2024 EXAM: 3 OR MORE VIEW(S) XRAY OF THE HAND 07/27/2024 02:54:00 AM COMPARISON: None available. CLINICAL HISTORY: sp fall down 10 stairs FINDINGS: BONES AND JOINTS: No acute fracture. No malalignment. SOFT TISSUES: The soft tissues are unremarkable. IMPRESSION: 1. No acute fracture or dislocation. Electronically signed by: Oneil Devonshire MD 07/27/2024 03:07 AM EST RP Workstation: [id number]    Procedures   Medications Ordered in the ED  ondansetron  (ZOFRAN ) injection 4 mg (4 mg Intravenous Given 07/27/24 0226)  morphine  (PF) 4 MG/ML injection 4 mg (4 mg Intravenous Given 07/27/24 0253)  acetaminophen  (TYLENOL ) tablet 1,000 mg (1,000 mg Oral Given 07/27/24 0413)     Medical Decision Making Amount and/or Complexity of Data Reviewed Radiology: ordered.  Risk OTC drugs. Prescription drug management.   58 year old female presents to ED after fall.  On exam, HD stable.  Alert and oriented x 4 with reassuring neurological examination.  No deficits noted.  Afebrile and nontachycardic.  Lung sounds clear bilaterally, no hypoxia.  Abdomen soft and compressible.  Tenderness to nasal bridge, ecchymosis and swelling to right orbit with nonpainful EOMs.  Assessed with CT head, cervical spine, maxillofacial, lumbar spine.  X-ray imaging ordered of bilateral hands, bilateral wrists, bilateral knees.  X-ray imaging of bilateral hands, wrists and knees unremarkable.  CT scan of head, lumbar spine, cervical spine  unremarkable.  CT maxillofacial shows depressed fracture of the right inferior orbital wall without evidence of extraocular muscle entrapment.  Also shows mildly impacted fracture of the anterior wall of the right maxillary antrum with associated blood within the right maxillary sinus.  Also noted to have mildly displaced right nasal bone fracture.  These findings were discussed with attending Dr. Raford who did not feel consult with facial trauma necessary. Patient with nonpainful EOMs on exam, no extraocular involvement on CT imaging. Patient made aware of exam findings. Advised she will need to follow up with facial trauma doctor as outpatient and she voiced understanding. Patient advised not to use straws or blow nose until seen by facial trauma. Patient will be sent home with pain medication, advised to only take this for breakthrough pain to avoid any repeat falls.  Will also send patient with ibuprofen.  Patient was given return precautions and she voiced understanding.  She had all of her questions answered to her satisfaction.  She is stable to discharge at this time.    Final diagnoses:  Fall, initial encounter  Closed fracture of nasal bone, initial encounter  Closed fracture of right maxillary sinus, initial encounter (HCC)  Closed fracture of orbital wall, initial encounter [facility])    ED Discharge Orders          Ordered  oxyCODONE -acetaminophen  (PERCOCET/ROXICET) 5-325 MG tablet  Every 6 hours PRN        07/27/24 0557               Ruthell Lonni FALCON, PA-C 07/27/24 0557    Raford Lenis, MD 07/27/24 [phone]  "

## 2024-07-27 NOTE — ED Triage Notes (Signed)
 BIB [facility] after pt fell down approx 10-12 stairs and landed on concrete pad. Pt c/o pain to bilat knees, bilat wrists, neck and face. Pt has right eye swelling and bruising with tenderness to palpation on nse and pain palpitation to rt jaw. Pt denies LOC, denies taking blood thinners.   BP 155/108 HR 64 Spo2 98% on RA 50 mcg fentanyl  given en route 20 g LAC

## 2024-07-30 ENCOUNTER — Ambulatory Visit (INDEPENDENT_AMBULATORY_CARE_PROVIDER_SITE_OTHER): Admitting: Otolaryngology

## 2024-07-30 ENCOUNTER — Encounter (INDEPENDENT_AMBULATORY_CARE_PROVIDER_SITE_OTHER): Payer: Self-pay | Admitting: Otolaryngology

## 2024-07-30 VITALS — BP 153/82 | HR 65 | Ht 62.0 in | Wt 217.0 lb

## 2024-07-30 DIAGNOSIS — S0993XA Unspecified injury of face, initial encounter: Secondary | ICD-10-CM

## 2024-07-30 DIAGNOSIS — S0231XA Fracture of orbital floor, right side, initial encounter for closed fracture: Secondary | ICD-10-CM

## 2024-07-30 DIAGNOSIS — S022XXA Fracture of nasal bones, initial encounter for closed fracture: Secondary | ICD-10-CM | POA: Diagnosis not present

## 2024-07-30 DIAGNOSIS — W109XXA Fall (on) (from) unspecified stairs and steps, initial encounter: Secondary | ICD-10-CM | POA: Diagnosis not present

## 2024-07-30 DIAGNOSIS — S0240CA Maxillary fracture, right side, initial encounter for closed fracture: Secondary | ICD-10-CM

## 2024-07-30 MED ORDER — FLUTICASONE PROPIONATE 50 MCG/ACT NA SUSP: 2.0000 | Freq: Every day | NASAL | 6 refills | Status: AC

## 2024-07-30 NOTE — Patient Instructions (Signed)
 Use flonase  two sprays each nostril daily for 4 weeks Avoid blowing your nose for about 1 week You can also use nasal saline spray or ocean spray for congestion as many times as you'd like

## 2024-07-30 NOTE — Progress Notes (Signed)
 Dear Dr. Bernardo, Here is my assessment for our mutual patient, Rachael Gilmore. Thank you for allowing me the opportunity to care for your patient. Please do not hesitate to contact me should you have any other questions. Sincerely, Dr. Eldora Blanch  Otolaryngology Clinic Note Referring provider: Dr. Bernardo HPI:  Rachael Gilmore is a 58 y.o. female kindly referred by Dr. Bernardo for evaluation of facial trauma  Initial visit (07/2024): Patient reports: fell down steps without LOC with right primarily facial bruising, swelling. This is improving. She reports mild nasal congestion but otherwise denies significant cosmetic deformity currently. This occurred on 07/27/2024 Patient additionally denies:  - other lacerations, malocclusion, teeth instability, trismus - enophthalmos, hypoglobus, vision loss or change, - trouble chewing or swallowing - epistaxis, hearing loss after trauma - otorrhea, vertigo.   Personal or FHx of bleeding dz or anesthesia difficulty: no  Independent Review of Additional Tests or Records:   CT Face (07/27/2024) independently interpreted: agree with read --  IMPRESSION: 1. Depressed fracture of the right inferior orbital wall 2. Mildly impacted fracture of the anterior wall of the right maxillary antrum with associated blood within the right maxillary sinus. 3. Mildly displaced right nasal bone fracture.  PMH/Meds/All/SocHx/FamHx/ROS:   Past Medical History:  Diagnosis Date   AF (amaurosis fugax) 04/16/2015   2010 Secondary to carotid artery disease.   Anxiety    Depression    Hyperlipidemia    Hyperparathyroidism 06/11/2010   Recheck in 2020 normal.    Hypertension    Macromastia    Sleep apnea    not used CPAP in 6 yrs   Stroke [facility])    TIA (transient ischemic attack) 01/14/2016   amaurosis fugax. [facility]. MRI brain unremarkable   Tinea corporis 07/21/2015     Past Surgical History:  Procedure Laterality Date   BREAST REDUCTION SURGERY Bilateral  11/01/2016   Procedure: BILATERAL BREAST REDUCTION WITH LIPOSUCTION;  Surgeon: Elna Pick, MD;  Location: [facility];  Service: Plastics;  Laterality: Bilateral;   BREAST SURGERY  07/2017   Revision of breast    CAROTID ENDARTERECTOMY Left 02/07/2009   [facility]   CESAREAN SECTION  1992 and 1994   KENALOG  INJECTION Left 11/01/2016   Procedure: KENALOG  INJECTION TO BILATERAL BREAST INCISIONS AND LEFT NECK SCAR;  Surgeon: Elna Pick, MD;  Location: [facility];  Service: Plastics;  Laterality: Left;   NECK SURGERY  07/26/2017   scar revision on left side of neck   TONSILLECTOMY  1969    Family History  Problem Relation Age of Onset   Albinism Son    Autism Son    Arrhythmia Mother    Breast cancer Sister      Social Connections: Not on file     Current Medications[1]   Physical Exam:   BP (!) 153/82 (BP Location: Left Arm, Patient Position: Sitting, Cuff Size: Large)   Pulse 65   Ht 5' 2 (1.575 m)   Wt 217 lb (98.4 kg)   SpO2 97%   BMI 39.69 kg/m   Salient findings:  CN II-XII intact - V2 sensation to light touch intact Bilateral EAC clear and TM intact with well pneumatized middle ear spaces Anterior rhinoscopy: Septum intact, no hematoma; bilateral inferior turbinates with modest hypertrophy No lesions of oral cavity/oropharynx; dentition fair; no mandible stepoff, no palatal mobility suggestive of fx No obviously palpable neck masses/lymphadenopathy/thyromegaly No respiratory distress or stridor EOM intact; Significant right facial bruising and swelling, no significant nasal bone stepoff  or other midface stepoff  Seprately Identifiable Procedures:  Prior to initiating any procedures, risks/benefits/alternatives were explained to the patient and verbal consent obtained. None  Impression & Plans:  Rachael Gilmore is a 58 y.o. female with:  1. Facial trauma, initial encounter    Fx all mildly displaced, improving bruising; mild  nasal congestion but after discussion of options, she opted for non-operative management. No nose blowing another week Can use flonase  BID for 2 weeks  See below regarding exact medications prescribed this encounter including dosages and route: Meds ordered this encounter  Medications   fluticasone  (FLONASE ) 50 MCG/ACT nasal spray    Sig: Place 2 sprays into both nostrils daily.    Dispense:  16 g    Refill:  6      Thank you for allowing me the opportunity to care for your patient. Please do not hesitate to contact me should you have any other questions.  Sincerely, Eldora Blanch, MD Otolaryngologist (ENT), [facility] Phone: [phone] Fax: [phone]  08/01/2024, 10:27 AM   MDM:  Level 3 - [phone] Complexity/Problems addressed: low Data complexity: mod - independent CT interpretation - Morbidity: low  - Prescription Drug prescribed or managed: n      [1]  Current Outpatient Medications:    albuterol  (VENTOLIN  HFA) 108 (90 Base) MCG/ACT inhaler, Inhale 1-2 puffs into the lungs every 6 (six) hours as needed for shortness of breath., Disp: 18 g, Rfl: 0   aspirin 325 MG tablet, Take 1 tablet by mouth daily., Disp: , Rfl:    Cholecalciferol (VITAMIN D -3) 25 MCG (1000 UT) CAPS, Take 5,000 Units by mouth daily., Disp: , Rfl:    Coenzyme Q10 (CO Q 10) 100 MG CAPS, Take 1 capsule by mouth daily., Disp: , Rfl:    fluticasone  (FLONASE ) 50 MCG/ACT nasal spray, Place 2 sprays into both nostrils daily., Disp: 16 g, Rfl: 6   Multiple Vitamin (MULTI-VITAMINS) TABS, Take 1 tablet by mouth daily., Disp: , Rfl:    nystatin  ointment (MYCOSTATIN ), Apply affected area twice daily as needed, Disp: 30 g, Rfl: 3   ondansetron  (ZOFRAN -ODT) 4 MG disintegrating tablet, Take 1 tablet (4 mg total) by mouth every 8 (eight) hours as needed for nausea or vomiting., Disp: 20 tablet, Rfl: 0   oxyCODONE -acetaminophen  (PERCOCET/ROXICET) 5-325 MG tablet, Take 1 tablet by mouth every 6  (six) hours as needed for severe pain (pain score 7-10)., Disp: 15 tablet, Rfl: 0   pravastatin  (PRAVACHOL ) 40 MG tablet, Take 1 tablet (40 mg total) by mouth daily. (Patient not taking: Reported on 07/30/2024), Disp: 90 tablet, Rfl: 4
